# Patient Record
Sex: Female | Born: 1960 | Race: Black or African American | Hispanic: No | Marital: Single | Smoking: Current every day smoker
Health system: Southern US, Community
[De-identification: ages and names within clinical notes are randomized; demographics above are authoritative.]

## PROBLEM LIST (undated history)

## (undated) DIAGNOSIS — F4310 Post-traumatic stress disorder, unspecified: Secondary | ICD-10-CM

## (undated) DIAGNOSIS — E785 Hyperlipidemia, unspecified: Secondary | ICD-10-CM

## (undated) DIAGNOSIS — K219 Gastro-esophageal reflux disease without esophagitis: Secondary | ICD-10-CM

## (undated) DIAGNOSIS — E119 Type 2 diabetes mellitus without complications: Secondary | ICD-10-CM

## (undated) DIAGNOSIS — J45909 Unspecified asthma, uncomplicated: Secondary | ICD-10-CM

## (undated) DIAGNOSIS — M719 Bursopathy, unspecified: Secondary | ICD-10-CM

## (undated) DIAGNOSIS — F32A Depression, unspecified: Secondary | ICD-10-CM

## (undated) DIAGNOSIS — F419 Anxiety disorder, unspecified: Secondary | ICD-10-CM

## (undated) DIAGNOSIS — F329 Major depressive disorder, single episode, unspecified: Secondary | ICD-10-CM

## (undated) DIAGNOSIS — G8929 Other chronic pain: Secondary | ICD-10-CM

## (undated) DIAGNOSIS — M549 Dorsalgia, unspecified: Secondary | ICD-10-CM

## (undated) DIAGNOSIS — M1990 Unspecified osteoarthritis, unspecified site: Secondary | ICD-10-CM

## (undated) HISTORY — PX: BILATERAL CARPAL TUNNEL RELEASE: SHX6508

## (undated) HISTORY — PX: SHOULDER ARTHROSCOPY: SHX128

## (undated) HISTORY — PX: FOOT SURGERY: SHX648

---

## 1998-09-12 ENCOUNTER — Ambulatory Visit (HOSPITAL_COMMUNITY): Admission: RE | Admit: 1998-09-12 | Discharge: 1998-09-12 | Payer: Self-pay | Admitting: *Deleted

## 1999-01-12 ENCOUNTER — Other Ambulatory Visit: Admission: RE | Admit: 1999-01-12 | Discharge: 1999-01-12 | Payer: Self-pay | Admitting: Gynecology

## 1999-05-21 ENCOUNTER — Encounter: Payer: Self-pay | Admitting: Emergency Medicine

## 1999-05-21 ENCOUNTER — Emergency Department (HOSPITAL_COMMUNITY): Admission: EM | Admit: 1999-05-21 | Discharge: 1999-05-21 | Payer: Self-pay | Admitting: Emergency Medicine

## 1999-06-21 ENCOUNTER — Other Ambulatory Visit: Admission: RE | Admit: 1999-06-21 | Discharge: 1999-06-21 | Payer: Self-pay | Admitting: Gynecology

## 1999-06-21 ENCOUNTER — Encounter (INDEPENDENT_AMBULATORY_CARE_PROVIDER_SITE_OTHER): Payer: Self-pay

## 2002-05-01 ENCOUNTER — Ambulatory Visit (HOSPITAL_COMMUNITY): Admission: RE | Admit: 2002-05-01 | Discharge: 2002-05-01 | Payer: Self-pay | Admitting: Family Medicine

## 2002-08-12 ENCOUNTER — Encounter: Payer: Self-pay | Admitting: *Deleted

## 2002-08-12 ENCOUNTER — Emergency Department (HOSPITAL_COMMUNITY): Admission: EM | Admit: 2002-08-12 | Discharge: 2002-08-12 | Payer: Self-pay | Admitting: Emergency Medicine

## 2002-08-26 ENCOUNTER — Encounter: Payer: Self-pay | Admitting: Family Medicine

## 2002-08-26 ENCOUNTER — Ambulatory Visit (HOSPITAL_COMMUNITY): Admission: RE | Admit: 2002-08-26 | Discharge: 2002-08-26 | Payer: Self-pay | Admitting: Family Medicine

## 2002-12-23 ENCOUNTER — Emergency Department (HOSPITAL_COMMUNITY): Admission: EM | Admit: 2002-12-23 | Discharge: 2002-12-23 | Payer: Self-pay | Admitting: Emergency Medicine

## 2003-05-12 ENCOUNTER — Ambulatory Visit (HOSPITAL_COMMUNITY): Admission: RE | Admit: 2003-05-12 | Discharge: 2003-05-12 | Payer: Self-pay | Admitting: Family Medicine

## 2003-08-26 ENCOUNTER — Ambulatory Visit (HOSPITAL_COMMUNITY): Admission: RE | Admit: 2003-08-26 | Discharge: 2003-08-26 | Payer: Self-pay | Admitting: Family Medicine

## 2003-10-01 ENCOUNTER — Encounter: Admission: RE | Admit: 2003-10-01 | Discharge: 2003-10-01 | Payer: Self-pay | Admitting: Orthopedic Surgery

## 2003-10-22 ENCOUNTER — Encounter: Admission: RE | Admit: 2003-10-22 | Discharge: 2003-10-22 | Payer: Self-pay | Admitting: Orthopedic Surgery

## 2005-06-15 ENCOUNTER — Emergency Department (HOSPITAL_COMMUNITY): Admission: EM | Admit: 2005-06-15 | Discharge: 2005-06-15 | Payer: Self-pay | Admitting: Emergency Medicine

## 2005-08-01 ENCOUNTER — Ambulatory Visit: Payer: Self-pay | Admitting: Internal Medicine

## 2005-08-10 ENCOUNTER — Ambulatory Visit (HOSPITAL_COMMUNITY): Admission: RE | Admit: 2005-08-10 | Discharge: 2005-08-10 | Payer: Self-pay | Admitting: Internal Medicine

## 2005-08-10 ENCOUNTER — Encounter (INDEPENDENT_AMBULATORY_CARE_PROVIDER_SITE_OTHER): Payer: Self-pay | Admitting: Internal Medicine

## 2005-08-13 ENCOUNTER — Encounter: Admission: RE | Admit: 2005-08-13 | Discharge: 2005-10-04 | Payer: Self-pay | Admitting: *Deleted

## 2005-08-20 ENCOUNTER — Ambulatory Visit: Payer: Self-pay | Admitting: Internal Medicine

## 2005-09-18 ENCOUNTER — Ambulatory Visit (HOSPITAL_COMMUNITY): Admission: RE | Admit: 2005-09-18 | Discharge: 2005-09-18 | Payer: Self-pay | Admitting: Hospitalist

## 2005-09-18 ENCOUNTER — Ambulatory Visit: Payer: Self-pay | Admitting: Hospitalist

## 2005-10-18 ENCOUNTER — Ambulatory Visit: Payer: Self-pay | Admitting: Internal Medicine

## 2005-11-23 ENCOUNTER — Ambulatory Visit: Payer: Self-pay | Admitting: Internal Medicine

## 2005-12-11 ENCOUNTER — Ambulatory Visit: Payer: Self-pay | Admitting: Internal Medicine

## 2005-12-13 ENCOUNTER — Ambulatory Visit (HOSPITAL_COMMUNITY): Admission: RE | Admit: 2005-12-13 | Discharge: 2005-12-13 | Payer: Self-pay | Admitting: *Deleted

## 2005-12-27 ENCOUNTER — Ambulatory Visit (HOSPITAL_COMMUNITY): Admission: RE | Admit: 2005-12-27 | Discharge: 2005-12-27 | Payer: Self-pay | Admitting: *Deleted

## 2006-01-21 ENCOUNTER — Ambulatory Visit: Payer: Self-pay | Admitting: Hospitalist

## 2006-02-04 ENCOUNTER — Ambulatory Visit: Payer: Self-pay | Admitting: Internal Medicine

## 2006-03-05 ENCOUNTER — Encounter: Admission: RE | Admit: 2006-03-05 | Discharge: 2006-03-05 | Payer: Self-pay | Admitting: Orthopaedic Surgery

## 2006-03-28 ENCOUNTER — Encounter: Admission: RE | Admit: 2006-03-28 | Discharge: 2006-03-28 | Payer: Self-pay | Admitting: Orthopaedic Surgery

## 2006-04-19 DIAGNOSIS — R030 Elevated blood-pressure reading, without diagnosis of hypertension: Secondary | ICD-10-CM | POA: Insufficient documentation

## 2006-04-19 DIAGNOSIS — N920 Excessive and frequent menstruation with regular cycle: Secondary | ICD-10-CM

## 2006-04-19 DIAGNOSIS — F17200 Nicotine dependence, unspecified, uncomplicated: Secondary | ICD-10-CM | POA: Insufficient documentation

## 2006-04-19 DIAGNOSIS — R945 Abnormal results of liver function studies: Secondary | ICD-10-CM

## 2006-04-19 DIAGNOSIS — IMO0002 Reserved for concepts with insufficient information to code with codable children: Secondary | ICD-10-CM

## 2006-04-19 DIAGNOSIS — R05 Cough: Secondary | ICD-10-CM | POA: Insufficient documentation

## 2006-04-19 DIAGNOSIS — G2581 Restless legs syndrome: Secondary | ICD-10-CM | POA: Insufficient documentation

## 2006-04-19 DIAGNOSIS — G5600 Carpal tunnel syndrome, unspecified upper limb: Secondary | ICD-10-CM

## 2006-04-19 DIAGNOSIS — E785 Hyperlipidemia, unspecified: Secondary | ICD-10-CM

## 2006-05-20 ENCOUNTER — Ambulatory Visit: Payer: Self-pay | Admitting: Internal Medicine

## 2006-05-20 ENCOUNTER — Encounter (INDEPENDENT_AMBULATORY_CARE_PROVIDER_SITE_OTHER): Payer: Self-pay | Admitting: Internal Medicine

## 2006-05-20 LAB — CONVERTED CEMR LAB: Vitamin B-12: 348 pg/mL (ref 211–911)

## 2006-06-17 DIAGNOSIS — F1010 Alcohol abuse, uncomplicated: Secondary | ICD-10-CM | POA: Insufficient documentation

## 2006-07-06 ENCOUNTER — Emergency Department (HOSPITAL_COMMUNITY): Admission: EM | Admit: 2006-07-06 | Discharge: 2006-07-06 | Payer: Self-pay | Admitting: Emergency Medicine

## 2006-07-18 ENCOUNTER — Encounter (INDEPENDENT_AMBULATORY_CARE_PROVIDER_SITE_OTHER): Payer: Self-pay | Admitting: *Deleted

## 2006-07-18 ENCOUNTER — Ambulatory Visit: Payer: Self-pay | Admitting: Internal Medicine

## 2006-07-18 LAB — CONVERTED CEMR LAB
ALT: 22 units/L (ref 0–35)
AST: 17 units/L (ref 0–37)
Albumin: 3.8 g/dL (ref 3.5–5.2)
BUN: 8 mg/dL (ref 6–23)
CO2: 26 meq/L (ref 19–32)
Calcium: 9.2 mg/dL (ref 8.4–10.5)
Chloride: 102 meq/L (ref 96–112)
Potassium: 4.5 meq/L (ref 3.5–5.3)

## 2006-08-15 ENCOUNTER — Ambulatory Visit: Payer: Self-pay | Admitting: Internal Medicine

## 2006-08-15 ENCOUNTER — Encounter (INDEPENDENT_AMBULATORY_CARE_PROVIDER_SITE_OTHER): Payer: Self-pay | Admitting: *Deleted

## 2006-08-15 DIAGNOSIS — K219 Gastro-esophageal reflux disease without esophagitis: Secondary | ICD-10-CM | POA: Insufficient documentation

## 2006-08-15 DIAGNOSIS — B351 Tinea unguium: Secondary | ICD-10-CM

## 2006-10-09 ENCOUNTER — Ambulatory Visit: Payer: Self-pay | Admitting: *Deleted

## 2006-10-09 ENCOUNTER — Encounter (INDEPENDENT_AMBULATORY_CARE_PROVIDER_SITE_OTHER): Payer: Self-pay | Admitting: *Deleted

## 2006-12-19 ENCOUNTER — Telehealth: Payer: Self-pay | Admitting: *Deleted

## 2007-10-10 ENCOUNTER — Emergency Department (HOSPITAL_COMMUNITY): Admission: EM | Admit: 2007-10-10 | Discharge: 2007-10-10 | Payer: Self-pay | Admitting: Emergency Medicine

## 2007-10-11 ENCOUNTER — Emergency Department (HOSPITAL_COMMUNITY): Admission: EM | Admit: 2007-10-11 | Discharge: 2007-10-11 | Payer: Self-pay | Admitting: Emergency Medicine

## 2008-09-22 ENCOUNTER — Ambulatory Visit: Payer: Self-pay | Admitting: Internal Medicine

## 2008-09-22 ENCOUNTER — Encounter (INDEPENDENT_AMBULATORY_CARE_PROVIDER_SITE_OTHER): Payer: Self-pay | Admitting: Internal Medicine

## 2009-06-13 ENCOUNTER — Emergency Department (HOSPITAL_COMMUNITY): Admission: EM | Admit: 2009-06-13 | Discharge: 2009-06-13 | Payer: Self-pay | Admitting: Emergency Medicine

## 2009-08-30 ENCOUNTER — Emergency Department (HOSPITAL_COMMUNITY): Admission: EM | Admit: 2009-08-30 | Discharge: 2009-08-30 | Payer: Self-pay | Admitting: Emergency Medicine

## 2010-01-29 ENCOUNTER — Emergency Department (HOSPITAL_COMMUNITY): Admission: EM | Admit: 2010-01-29 | Discharge: 2010-01-29 | Payer: Self-pay | Admitting: Emergency Medicine

## 2010-04-29 ENCOUNTER — Emergency Department (HOSPITAL_COMMUNITY): Admission: EM | Admit: 2010-04-29 | Discharge: 2010-04-30 | Payer: Self-pay | Admitting: Emergency Medicine

## 2010-07-11 NOTE — Letter (Signed)
Summary: MAMMOGRAM  MAMMOGRAM   Imported By: Margie Billet 10/13/2009 11:37:46  _____________________________________________________________________  External Attachment:    Type:   Image     Comment:   External Document

## 2010-08-27 LAB — BASIC METABOLIC PANEL WITH GFR
BUN: 10 mg/dL (ref 6–23)
CO2: 26 meq/L (ref 19–32)
Calcium: 8.7 mg/dL (ref 8.4–10.5)
Chloride: 105 meq/L (ref 96–112)
Creatinine, Ser: 0.81 mg/dL (ref 0.4–1.2)
GFR calc non Af Amer: >60 mL/min
Glucose, Bld: 106 mg/dL — ABNORMAL HIGH (ref 70–99)
Potassium: 3.6 meq/L (ref 3.5–5.1)
Sodium: 139 meq/L (ref 135–145)

## 2010-08-27 LAB — GLUCOSE, CAPILLARY: Glucose-Capillary: 118 mg/dL — ABNORMAL HIGH (ref 70–99)

## 2013-01-15 ENCOUNTER — Encounter (HOSPITAL_COMMUNITY): Payer: Self-pay | Admitting: Cardiology

## 2013-01-15 ENCOUNTER — Emergency Department (HOSPITAL_COMMUNITY)
Admission: EM | Admit: 2013-01-15 | Discharge: 2013-01-15 | Disposition: A | Discharge status: Home / Self Care | Payer: Self-pay | Attending: Emergency Medicine | Admitting: Emergency Medicine

## 2013-01-15 DIAGNOSIS — Z79899 Other long term (current) drug therapy: Secondary | ICD-10-CM | POA: Insufficient documentation

## 2013-01-15 DIAGNOSIS — G8929 Other chronic pain: Secondary | ICD-10-CM | POA: Insufficient documentation

## 2013-01-15 DIAGNOSIS — M545 Low back pain, unspecified: Secondary | ICD-10-CM | POA: Insufficient documentation

## 2013-01-15 DIAGNOSIS — E785 Hyperlipidemia, unspecified: Secondary | ICD-10-CM | POA: Insufficient documentation

## 2013-01-15 DIAGNOSIS — Z7982 Long term (current) use of aspirin: Secondary | ICD-10-CM | POA: Insufficient documentation

## 2013-01-15 DIAGNOSIS — M62830 Muscle spasm of back: Secondary | ICD-10-CM

## 2013-01-15 DIAGNOSIS — M5380 Other specified dorsopathies, site unspecified: Secondary | ICD-10-CM | POA: Insufficient documentation

## 2013-01-15 DIAGNOSIS — E119 Type 2 diabetes mellitus without complications: Secondary | ICD-10-CM | POA: Insufficient documentation

## 2013-01-15 HISTORY — DX: Hyperlipidemia, unspecified: E78.5

## 2013-01-15 HISTORY — DX: Type 2 diabetes mellitus without complications: E11.9

## 2013-01-15 HISTORY — DX: Other chronic pain: G89.29

## 2013-01-15 HISTORY — DX: Dorsalgia, unspecified: M54.9

## 2013-01-15 MED ORDER — HYDROMORPHONE HCL PF 1 MG/ML IJ SOLN
1.0000 mg | Freq: Once | INTRAMUSCULAR | Status: AC
  Administered 2013-01-15: 1 mg via INTRAMUSCULAR
  Filled 2013-01-15: qty 1

## 2013-01-15 MED ORDER — OXYCODONE-ACETAMINOPHEN 5-325 MG PO TABS: 1.0000 | ORAL_TABLET | ORAL | Status: DC | PRN

## 2013-01-15 MED ORDER — METHOCARBAMOL 500 MG PO TABS
750.0000 mg | ORAL_TABLET | Freq: Once | ORAL | Status: AC
  Administered 2013-01-15: 750 mg via ORAL
  Filled 2013-01-15: qty 2

## 2013-01-15 MED ORDER — METHOCARBAMOL 750 MG PO TABS: 750.0000 mg | ORAL_TABLET | Freq: Four times a day (QID) | ORAL | Status: DC | PRN

## 2013-01-15 NOTE — ED Notes (Signed)
Pt reports lower back pain over the past couple of days. States that the pain is on-going, and that she normally takes medication but its not helping. Denies any recent injury.

## 2013-01-15 NOTE — ED Provider Notes (Signed)
Medical screening examination/treatment/procedure(s) were performed by non-physician practitioner and as supervising physician I was immediately available for consultation/collaboration.   Audree Camel, MD 01/15/13 [phone]

## 2013-01-15 NOTE — ED Notes (Signed)
Pt reports lower back pain that shoots down left leg since Thursday.  Pt has HX of degenerative disc disease and her current pain meds are not helping.  Pt denies injury. Pt alert oriented X4

## 2013-01-15 NOTE — ED Provider Notes (Signed)
CSN: [id number]     Arrival date & time 01/15/13  1417 History  This chart was scribed for non-physician practitioner Dierdre Forth, PA-C, working with Audree Camel, MD, by Yevette Edwards, ED Scribe. This patient was seen in room TR05C/TR05C and the patient's care was started at 3:47 PM.   First MD Initiated Contact with Patient 01/15/13 1510     Chief Complaint  Patient presents with  . Back Pain    Patient is a 52 y.o. female presenting with back pain. The history is provided by the patient. No language interpreter was used.  Back Pain Location:  Sacro-iliac joint Radiates to:  L posterior upper leg Pain severity:  Severe Progression:  Worsening Chronicity:  Chronic Context: not falling and not MVA   Relieved by:  Nothing Worsened by:  Ambulation and movement Associated symptoms: no bladder incontinence, no bowel incontinence, no dysuria, no numbness and no weakness   Risk factors: obesity    HPI Comments: Shawna Horton is a 52 y.o. female, with a history of chronic back pain, who presents to the Emergency Department complaining of worsening pain to her lower back. She has had the back pain for years, but the pain has worsened in the past month and has acutely worsened in the past week. The pt states that for approximately a week, the pain has been radiating down her the lateral aspect of her left leg, and she ranks the pain as a 10/10. She states that ambulation, standing for an extended period and movement increase the pain, and that nothing lessens the pain. The pt denies experiencing any recent injuries or trauma to her lower back. She also denies numbness, weakness, urinary incontinence, bowel incontinence, or dysuria.  The pt is currently taking tromadol, hydrocodone, and cyclobenzaprine daily, but she reports little resolution to the back pain with her current medication. She has received epidural injections to her back, and she reports improvement due to the injections.  She received an injection of pain medication a week ago, and she reports no improvement with it.  She denies attempting any back exercises to strengthen and stretch her back.  She is scheduled for an MRI at the [facility]. on February 03, 2013.     Past Medical History  Diagnosis Date  . Back pain, chronic   . Diabetes mellitus without complication   . Hyperlipemia    History reviewed. No pertinent past surgical history. History reviewed. No pertinent family history. History  Substance Use Topics  . Smoking status: Never Smoker   . Smokeless tobacco: Not on file  . Alcohol Use: Yes   No OB history provided.  Review of Systems  Gastrointestinal: Negative for bowel incontinence.  Genitourinary: Negative for bladder incontinence, dysuria and urgency.  Musculoskeletal: Positive for back pain.  Neurological: Negative for weakness and numbness.  All other systems reviewed and are negative.    Allergies  Review of patient's allergies indicates no known allergies.  Home Medications   Current Outpatient Rx  Name  Route  Sig  Dispense  Refill  . albuterol (PROVENTIL HFA;VENTOLIN HFA) 108 (90 BASE) MCG/ACT inhaler   Inhalation   Inhale 2 puffs into the lungs every 6 (six) hours as needed for wheezing or shortness of breath.         Marland Kitchen aspirin EC 81 MG tablet   Oral   Take 81 mg by mouth daily.         . Cholecalciferol (VITAMIN D PO)   Oral  Take 2 tablets by mouth daily.         . cyclobenzaprine (FLEXERIL) 10 MG tablet   Oral   Take 10 mg by mouth 3 (three) times daily as needed for muscle spasms.         Marland Kitchen ibuprofen (ADVIL,MOTRIN) 800 MG tablet   Oral   Take 800 mg by mouth every 8 (eight) hours as needed for pain.         . metFORMIN (GLUCOPHAGE) 500 MG tablet   Oral   Take 500 mg by mouth 2 (two) times daily with a meal.         . PRAVASTATIN SODIUM PO   Oral   Take 1 tablet by mouth daily.         . SERTRALINE HCL PO   Oral   Take 1 tablet by  mouth daily.         . traMADol (ULTRAM) 50 MG tablet   Oral   Take 100 mg by mouth every 6 (six) hours as needed for pain.         . VENLAFAXINE HCL PO   Oral   Take 1 each by mouth daily.         . methocarbamol (ROBAXIN) 750 MG tablet   Oral   Take 1 tablet (750 mg total) by mouth 4 (four) times daily as needed (Take 1 tablet every 6 hours as needed for muscle spasms.).   20 tablet   0   . oxyCODONE-acetaminophen (PERCOCET) 5-325 MG per tablet   Oral   Take 1 tablet by mouth every 4 (four) hours as needed for pain (Take 1- 2 tablets every 4 - 6 hours as needed for pain.).   20 tablet   0    Triage Vitals: BP 157/89  Pulse 79  Temp(Src) 98.2 F (36.8 C) (Oral)  Resp 16  SpO2 98%  Physical Exam  Nursing note and vitals reviewed. Constitutional: She appears well-developed and well-nourished. No distress.  Obese.  HENT:  Head: Normocephalic and atraumatic.  Mouth/Throat: Oropharynx is clear and moist. No oropharyngeal exudate.  Eyes: Conjunctivae are normal.  Neck: Normal range of motion. Neck supple.  Full ROM without pain  Cardiovascular: Normal rate, regular rhythm and intact distal pulses.   Pulmonary/Chest: Effort normal and breath sounds normal. No respiratory distress. She has no wheezes.  Abdominal: Soft. She exhibits no distension. There is no tenderness.  Musculoskeletal:  Full range of motion of the T-spine and L-spine No tenderness to palpation of the spinous processes of the T-spine or L-spine.  Pain to palpation of the L paraspinal muscles  Lymphadenopathy:    She has no cervical adenopathy.  Neurological: She is alert. She has normal reflexes. She exhibits normal muscle tone. Coordination normal. GCS eye subscore is 4. GCS verbal subscore is 5. GCS motor subscore is 6.  Reflex Scores:      Patellar reflexes are 2+ on the right side and 2+ on the left side.      Achilles reflexes are 2+ on the right side and 2+ on the left side. Speech is clear  and goal oriented, follows commands Normal strength in upper and lower extremities bilaterally including dorsiflexion and plantar flexion, strong and equal grip strength Sensation normal to light and sharp touch Moves extremities without ataxia, coordination intact Stiff gait.  Normal balance  Skin: Skin is warm and dry. No rash noted. She is not diaphoretic. No erythema.    ED Course  DIAGNOSTIC STUDIES: Oxygen Saturation is 98% on room air, normal by my interpretation.    COORDINATION OF CARE:  3:55 PM-Discussed treatment plan with patient which includes different pain medication and exercises for her back, and the patient agreed to the plan.   Procedures (including critical care time)  Labs Reviewed - No data to display No results found. 1. Back muscle spasm   2. Low back pain     MDM  Antoine C Lingenfelter presents for acute exacerbation of her chronic back pain.  Nontraumatic exacerbation.  Patient with back pain.  No neurological deficits and normal neuro exam.  Patient can walk but states is painful.  No loss of bowel or bladder control.  No concern for cauda equina.  No fever, night sweats, weight loss, h/o cancer, IVDU.  RICE protocol and pain medicine indicated and discussed with patient.  I have also discussed reasons to return immediately to the ER.  Patient expresses understanding and agrees with plan.  I personally performed the services described in this documentation, which was scribed in my presence. The recorded information has been reviewed and is accurate.      Dahlia Client Russie Gulledge, PA-C 01/15/13 [phone]

## 2014-03-01 ENCOUNTER — Emergency Department (HOSPITAL_COMMUNITY)
Admission: EM | Admit: 2014-03-01 | Discharge: 2014-03-01 | Disposition: A | Discharge status: Home / Self Care | Payer: Self-pay | Attending: Emergency Medicine | Admitting: Emergency Medicine

## 2014-03-01 ENCOUNTER — Encounter (HOSPITAL_COMMUNITY): Payer: Self-pay | Admitting: Emergency Medicine

## 2014-03-01 DIAGNOSIS — M545 Low back pain, unspecified: Secondary | ICD-10-CM | POA: Insufficient documentation

## 2014-03-01 DIAGNOSIS — G8929 Other chronic pain: Secondary | ICD-10-CM | POA: Insufficient documentation

## 2014-03-01 DIAGNOSIS — Z7982 Long term (current) use of aspirin: Secondary | ICD-10-CM | POA: Insufficient documentation

## 2014-03-01 DIAGNOSIS — E119 Type 2 diabetes mellitus without complications: Secondary | ICD-10-CM | POA: Insufficient documentation

## 2014-03-01 DIAGNOSIS — Z79899 Other long term (current) drug therapy: Secondary | ICD-10-CM | POA: Insufficient documentation

## 2014-03-01 LAB — URINALYSIS, ROUTINE W REFLEX MICROSCOPIC
Bilirubin Urine: NEGATIVE
Glucose, UA: NEGATIVE mg/dL
Hgb urine dipstick: NEGATIVE
KETONES UR: NEGATIVE mg/dL
LEUKOCYTES UA: NEGATIVE
NITRITE: NEGATIVE
PH: 6 (ref 5.0–8.0)
Protein, ur: NEGATIVE mg/dL
SPECIFIC GRAVITY, URINE: 1.016 (ref 1.005–1.030)
UROBILINOGEN UA: 0.2 mg/dL (ref 0.0–1.0)

## 2014-03-01 MED ORDER — OXYCODONE-ACETAMINOPHEN 5-325 MG PO TABS: 1.0000 | ORAL_TABLET | ORAL | Status: DC | PRN

## 2014-03-01 NOTE — ED Notes (Signed)
Left back pain for a couple of weeks was seen at [facility] in [place] last week and given 2 shots  Helped a little pain is back

## 2014-03-01 NOTE — ED Provider Notes (Signed)
CSN: [id number]     Arrival date & time 03/01/14  0809 History  This chart was scribed for non-physician practitioner, Jaynie Crumble, PA-C working with Audree Camel, MD by Greggory Stallion, ED scribe. This patient was seen in room TR10C/TR10C and the patient's care was started at 9:01 AM.   Chief Complaint  Patient presents with  . Back Pain   The history is provided by the patient. No language interpreter was used.   HPI Comments: Shawna Horton is a 53 y.o. female who presents to the Emergency Department complaining of sharp left mid to lower back pain that started 2 weeks ago. Pain radiates into her left leg but states that is chronic. Denies recent injury. States this is not similar to her chronic back pain. Pt was seen at the ED in [place] last week and given two shots with some relief. Bending and turning worsen the pain. She has taken tramadol, flexeril and 800 mg ibuprofen with little relief. Pt has also used a heating pad and used ice with no relief. Denies dysuria, urinary frequency, urgency.   Past Medical History  Diagnosis Date  . Back pain, chronic   . Diabetes mellitus without complication   . Hyperlipemia    History reviewed. No pertinent past surgical history. No family history on file. History  Substance Use Topics  . Smoking status: Never Smoker   . Smokeless tobacco: Not on file  . Alcohol Use: Yes   OB History   Grav Para Term Preterm Abortions TAB SAB Ect Mult Living                 Review of Systems  Genitourinary: Negative for dysuria, urgency and frequency.  Musculoskeletal: Positive for back pain and myalgias.  All other systems reviewed and are negative.  Allergies  Review of patient's allergies indicates no known allergies.  Home Medications   Prior to Admission medications   Medication Sig Start Date End Date Taking? Authorizing Provider  albuterol (PROVENTIL HFA;VENTOLIN HFA) 108 (90 BASE) MCG/ACT inhaler Inhale 2 puffs into the lungs  every 6 (six) hours as needed for wheezing or shortness of breath.    Historical Provider, MD  aspirin EC 81 MG tablet Take 81 mg by mouth daily.    Historical Provider, MD  Cholecalciferol (VITAMIN D PO) Take 2 tablets by mouth daily.    Historical Provider, MD  cyclobenzaprine (FLEXERIL) 10 MG tablet Take 10 mg by mouth 3 (three) times daily as needed for muscle spasms.    Historical Provider, MD  ibuprofen (ADVIL,MOTRIN) 800 MG tablet Take 800 mg by mouth every 8 (eight) hours as needed for pain.    Historical Provider, MD  metFORMIN (GLUCOPHAGE) 500 MG tablet Take 500 mg by mouth 2 (two) times daily with a meal.    Historical Provider, MD  methocarbamol (ROBAXIN) 750 MG tablet Take 1 tablet (750 mg total) by mouth 4 (four) times daily as needed (Take 1 tablet every 6 hours as needed for muscle spasms.). 01/15/13   Hannah Muthersbaugh, PA-C  oxyCODONE-acetaminophen (PERCOCET) 5-325 MG per tablet Take 1 tablet by mouth every 4 (four) hours as needed for pain (Take 1- 2 tablets every 4 - 6 hours as needed for pain.). 01/15/13   Hannah Muthersbaugh, PA-C  PRAVASTATIN SODIUM PO Take 1 tablet by mouth daily.    Historical Provider, MD  SERTRALINE HCL PO Take 1 tablet by mouth daily.    Historical Provider, MD  traMADol (ULTRAM) 50 MG tablet Take  100 mg by mouth every 6 (six) hours as needed for pain.    Historical Provider, MD  VENLAFAXINE HCL PO Take 1 each by mouth daily.    Historical Provider, MD   BP 139/99  Pulse 96  Temp(Src) 98.1 F (36.7 C) (Oral)  Resp 18  Ht 5' 8.5" (1.74 m)  Wt 250 lb (113.399 kg)  BMI 37.46 kg/m2  SpO2 97%  Physical Exam  Nursing note and vitals reviewed. Constitutional: She is oriented to person, place, and time. She appears well-developed and well-nourished. No distress.  HENT:  Head: Normocephalic and atraumatic.  Eyes: Conjunctivae and EOM are normal.  Neck: Neck supple. No tracheal deviation present.  Cardiovascular: Normal rate.   Pulmonary/Chest: Effort  normal. No respiratory distress.  Abdominal: There is no tenderness.  Left CVA tenderness  Musculoskeletal: Normal range of motion.  No midline lumbar spine tenderness, tenderness over left paravertebral spinous muscles, left flank. No pain with straight leg raise bilaterally.  Neurological: She is alert and oriented to person, place, and time.  5/5 and equal lower extremity strength. 2+ and equal patellar reflexes bilaterally. Pt able to dorsiflex bilateral toes and feet with good strength against resistance. Equal sensation bilaterally over thighs and lower legs.    Skin: Skin is warm and dry.  Psychiatric: She has a normal mood and affect. Her behavior is normal.    ED Course  Procedures (including critical care time)  DIAGNOSTIC STUDIES: Oxygen Saturation is 97% on RA, normal by my interpretation.    COORDINATION OF CARE: 9:04 AM-Discussed treatment plan which includes UA with pt at bedside and pt agreed to plan.   Labs Review Labs Reviewed - No data to display  Imaging Review No results found.   EKG Interpretation None      MDM   Final diagnoses:  Left-sided low back pain without sciatica    Patient with left lower back pain, neurovascularly intact, left CVA tenderness present as well. Will get urinalysis.   Urinalysis is negative. I suspect pain is mainly muscular, doubt radiculopathy. Will treat with muscle relaxants, pain medication. Patient is already on Flexeril. Will add Percocet for severe pain. Instructed to followup with her primary care Dr. Patient states she's waiting to get in to pain management for chronic back pain.  Filed Vitals:   03/01/14 0814 03/01/14 1005  BP: 139/99 150/93  Pulse: 96 83  Temp: 98.1 F (36.7 C) 98.1 F (36.7 C)  TempSrc: Oral Oral  Resp: 18 17  Height: 5' 8.5" (1.74 m)   Weight: 250 lb (113.399 kg)   SpO2: 97% 99%     I personally performed the services described in this documentation, which was scribed in my  presence. The recorded information has been reviewed and is accurate.  Lottie Mussel, PA-C 03/01/14 1625

## 2014-03-01 NOTE — Discharge Instructions (Signed)
Continue your current medication. Take Percocet for severe pain. Try rolling out your back with a lacrosse ball. Try stretches and exercises listed below. Followup with your doctor   Back Exercises Back exercises help treat and prevent back injuries. The goal of back exercises is to increase the strength of your abdominal and back muscles and the flexibility of your back. These exercises should be started when you no longer have back pain. Back exercises include:  Pelvic Tilt. Lie on your back with your knees bent. Tilt your pelvis until the lower part of your back is against the floor. Hold this position 5 to 10 sec and repeat 5 to 10 times.  Knee to Chest. Pull first 1 knee up against your chest and hold for 20 to 30 seconds, repeat this with the other knee, and then both knees. This may be done with the other leg straight or bent, whichever feels better.  Sit-Ups or Curl-Ups. Bend your knees 90 degrees. Start with tilting your pelvis, and do a partial, slow sit-up, lifting your trunk only 30 to 45 degrees off the floor. Take at least 2 to 3 seconds for each sit-up. Do not do sit-ups with your knees out straight. If partial sit-ups are difficult, simply do the above but with only tightening your abdominal muscles and holding it as directed.  Hip-Lift. Lie on your back with your knees flexed 90 degrees. Push down with your feet and shoulders as you raise your hips a couple inches off the floor; hold for 10 seconds, repeat 5 to 10 times.  Back arches. Lie on your stomach, propping yourself up on bent elbows. Slowly press on your hands, causing an arch in your low back. Repeat 3 to 5 times. Any initial stiffness and discomfort should lessen with repetition over time.  Shoulder-Lifts. Lie face down with arms beside your body. Keep hips and torso pressed to floor as you slowly lift your head and shoulders off the floor. Do not overdo your exercises, especially in the beginning. Exercises may cause you  some mild back discomfort which lasts for a few minutes; however, if the pain is more severe, or lasts for more than 15 minutes, do not continue exercises until you see your caregiver. Improvement with exercise therapy for back problems is slow.  See your caregivers for assistance with developing a proper back exercise program. Document Released: 07/05/2004 Document Revised: 08/20/2011 Document Reviewed: 03/29/2011 [facility] Patient Information 2015 [place], [place]. This information is not intended to replace advice given to you by your health care provider. Make sure you discuss any questions you have with your health care provider.   Back Pain, Adult Low back pain is very common. About 1 in 5 people have back pain.The cause of low back pain is rarely dangerous. The pain often gets better over time.About half of people with a sudden onset of back pain feel better in just 2 weeks. About 8 in 10 people feel better by 6 weeks.  CAUSES Some common causes of back pain include:  Strain of the muscles or ligaments supporting the spine.  Wear and tear (degeneration) of the spinal discs.  Arthritis.  Direct injury to the back. DIAGNOSIS Most of the time, the direct cause of low back pain is not known.However, back pain can be treated effectively even when the exact cause of the pain is unknown.Answering your caregiver's questions about your overall health and symptoms is one of the most accurate ways to make sure the cause of your  pain is not dangerous. If your caregiver needs more information, he or she may order lab work or imaging tests (X-rays or MRIs).However, even if imaging tests show changes in your back, this usually does not require surgery. HOME CARE INSTRUCTIONS For many people, back pain returns.Since low back pain is rarely dangerous, it is often a condition that people can learn to [facility] their own.   Remain active. It is stressful on the back to sit or stand in one place. Do  not sit, drive, or stand in one place for more than 30 minutes at a time. Take short walks on level surfaces as soon as pain allows.Try to increase the length of time you walk each day.  Do not stay in bed.Resting more than 1 or 2 days can delay your recovery.  Do not avoid exercise or work.Your body is made to move.It is not dangerous to be active, even though your back may hurt.Your back will likely heal faster if you return to being active before your pain is gone.  Pay attention to your body when you bend and lift. Many people have less discomfortwhen lifting if they bend their knees, keep the load close to their bodies,and avoid twisting. Often, the most comfortable positions are those that put less stress on your recovering back.  Find a comfortable position to sleep. Use a firm mattress and lie on your side with your knees slightly bent. If you lie on your back, put a pillow under your knees.  Only take over-the-counter or prescription medicines as directed by your caregiver. Over-the-counter medicines to reduce pain and inflammation are often the most helpful.Your caregiver may prescribe muscle relaxant drugs.These medicines help dull your pain so you can more quickly return to your normal activities and healthy exercise.  Put ice on the injured area.  Put ice in a plastic bag.  Place a towel between your skin and the bag.  Leave the ice on for 15-20 minutes, 03-04 times a day for the first 2 to 3 days. After that, ice and heat may be alternated to reduce pain and spasms.  Ask your caregiver about trying back exercises and gentle massage. This may be of some benefit.  Avoid feeling anxious or stressed.Stress increases muscle tension and can worsen back pain.It is important to recognize when you are anxious or stressed and learn ways to manage it.Exercise is a great option. SEEK MEDICAL CARE IF:  You have pain that is not relieved with rest or medicine.  You have pain  that does not improve in 1 week.  You have new symptoms.  You are generally not feeling well. SEEK IMMEDIATE MEDICAL CARE IF:   You have pain that radiates from your back into your legs.  You develop new bowel or bladder control problems.  You have unusual weakness or numbness in your arms or legs.  You develop nausea or vomiting.  You develop abdominal pain.  You feel faint. Document Released: 05/28/2005 Document Revised: 11/27/2011 Document Reviewed: 09/29/2013 [facility] Patient Information 2015 [place], [place]. This information is not intended to replace advice given to you by your health care provider. Make sure you discuss any questions you have with your health care provider.

## 2014-03-01 NOTE — ED Provider Notes (Signed)
Medical screening examination/treatment/procedure(s) were performed by non-physician practitioner and as supervising physician I was immediately available for consultation/collaboration.  Audree Camel, MD 03/01/14 [phone]

## 2014-03-02 NOTE — Discharge Planning (Signed)
[facility] Community Liaison  Spoke to patient regarding primary care resources and establishing care with a provider. Patient states she currently is followed with care by the [facility] hospital. Resource guide and my contact information provided for any future questions or concerns. No other Community Liaison needs identified at this time.

## 2014-03-15 ENCOUNTER — Encounter (HOSPITAL_COMMUNITY): Payer: Self-pay | Admitting: Emergency Medicine

## 2014-03-15 ENCOUNTER — Emergency Department (HOSPITAL_COMMUNITY)
Admission: EM | Admit: 2014-03-15 | Discharge: 2014-03-15 | Disposition: A | Discharge status: Home / Self Care | Payer: Self-pay | Attending: Emergency Medicine | Admitting: Emergency Medicine

## 2014-03-15 DIAGNOSIS — Z7982 Long term (current) use of aspirin: Secondary | ICD-10-CM | POA: Insufficient documentation

## 2014-03-15 DIAGNOSIS — E119 Type 2 diabetes mellitus without complications: Secondary | ICD-10-CM | POA: Insufficient documentation

## 2014-03-15 DIAGNOSIS — S29012D Strain of muscle and tendon of back wall of thorax, subsequent encounter: Secondary | ICD-10-CM

## 2014-03-15 DIAGNOSIS — E785 Hyperlipidemia, unspecified: Secondary | ICD-10-CM | POA: Insufficient documentation

## 2014-03-15 DIAGNOSIS — S233XXD Sprain of ligaments of thoracic spine, subsequent encounter: Secondary | ICD-10-CM | POA: Insufficient documentation

## 2014-03-15 DIAGNOSIS — G8929 Other chronic pain: Secondary | ICD-10-CM | POA: Insufficient documentation

## 2014-03-15 DIAGNOSIS — Z79899 Other long term (current) drug therapy: Secondary | ICD-10-CM | POA: Insufficient documentation

## 2014-03-15 MED ORDER — KETOROLAC TROMETHAMINE 30 MG/ML IJ SOLN
30.0000 mg | Freq: Once | INTRAMUSCULAR | Status: AC
  Administered 2014-03-15: 30 mg via INTRAVENOUS
  Filled 2014-03-15: qty 1

## 2014-03-15 MED ORDER — DIAZEPAM 5 MG PO TABS: 5.0000 mg | ORAL_TABLET | Freq: Three times a day (TID) | ORAL | Status: DC | PRN

## 2014-03-15 MED ORDER — IBUPROFEN 800 MG PO TABS: 800.0000 mg | ORAL_TABLET | Freq: Three times a day (TID) | ORAL | Status: DC | PRN

## 2014-03-15 MED ORDER — OXYCODONE-ACETAMINOPHEN 5-325 MG PO TABS: 1.0000 | ORAL_TABLET | ORAL | Status: DC | PRN

## 2014-03-15 MED ORDER — MORPHINE SULFATE 4 MG/ML IJ SOLN
4.0000 mg | Freq: Once | INTRAMUSCULAR | Status: AC
  Administered 2014-03-15: 4 mg via INTRAVENOUS
  Filled 2014-03-15: qty 1

## 2014-03-15 NOTE — ED Provider Notes (Signed)
TIME SEEN: 10:32 PM  CHIEF COMPLAINT: Left thoracic back pain  HPI: Patient is a [age] y.o. F with history of chronic back pain, diabetes, hyperlipidemia who presents emergency department with exacerbation of her chronic back pain for the past 2 weeks. She has been seen in the emergency department in August as well September for the same pain. She denies any new injury or increased exertion. No numbness, tingling or focal weakness. No bowel or bladder incontinence. No urinary retention. No fever. No shortness of breath or chest pain. No dysuria or hematuria. Pain is worse with movement and palpation. Better with rest. No radiation of pain.. She is on tramadol and ibuprofen chronically for her chronic pain. She reports she has not followed up [facility].  ROS: See HPI Constitutional: no fever  Eyes: no drainage  ENT: no runny nose   Cardiovascular:  no chest pain  Resp: no SOB  GI: no vomiting GU: no dysuria Integumentary: no rash  Allergy: no hives  Musculoskeletal: no leg swelling  Neurological: no slurred speech ROS otherwise negative  PAST MEDICAL HISTORY/PAST SURGICAL HISTORY:  Past Medical History  Diagnosis Date  . Back pain, chronic   . Diabetes mellitus without complication   . Hyperlipemia     MEDICATIONS:  Prior to Admission medications   Medication Sig Start Date End Date Taking? Authorizing Provider  albuterol (PROVENTIL HFA;VENTOLIN HFA) 108 (90 BASE) MCG/ACT inhaler Inhale 2 puffs into the lungs every 6 (six) hours as needed for wheezing or shortness of breath.   Yes Historical Provider, MD  aspirin EC 81 MG tablet Take 81 mg by mouth daily.   Yes Historical Provider, MD  ATORVASTATIN CALCIUM PO Take 1.5 tablets by mouth daily.   Yes Historical Provider, MD  cyclobenzaprine (FLEXERIL) 10 MG tablet Take 10 mg by mouth 3 (three) times daily as needed for muscle spasms.   Yes Historical Provider, MD  ibuprofen (ADVIL,MOTRIN) 800 MG tablet  Take 800 mg by mouth every 6 (six) hours as needed.    Yes Historical Provider, MD  metFORMIN (GLUCOPHAGE) 500 MG tablet Take 500 mg by mouth 2 (two) times daily with a meal.   Yes Historical Provider, MD  SERTRALINE HCL PO Take 1 tablet by mouth daily.   Yes Historical Provider, MD  traMADol (ULTRAM) 50 MG tablet Take 100 mg by mouth every 6 (six) hours as needed for pain.   Yes Historical Provider, MD  Vitamin D, Ergocalciferol, (DRISDOL) 50000 UNITS CAPS capsule Take 50,000 Units by mouth every 7 (seven) days.   Yes Historical Provider, MD    ALLERGIES:  No Known Allergies  SOCIAL HISTORY:  History  Substance Use Topics  . Smoking status: Never Smoker   . Smokeless tobacco: Not on file  . Alcohol Use: Yes    FAMILY HISTORY: History reviewed. No pertinent family history.  EXAM: BP 129/84  Pulse 78  Temp(Src) 98.2 F (36.8 C) (Oral)  Resp 20  SpO2 97% CONSTITUTIONAL: Alert and oriented and responds appropriately to questions. Well-appearing; well-nourished HEAD: Normocephalic EYES: Conjunctivae clear, PERRL ENT: normal nose; no rhinorrhea; moist mucous membranes; pharynx without lesions noted NECK: Supple, no meningismus, no LAD  CARD: RRR; S1 and S2 appreciated; no murmurs, no clicks, no rubs, no gallops RESP: Normal chest excursion without splinting or tachypnea; breath sounds clear and equal bilaterally; no wheezes, no rhonchi, no rales,  ABD/GI: Normal bowel sounds; non-distended; soft, non-tender, no rebound, no guarding BACK:  The  back appears normal and is tender to palpation over the left flank without crepitus or ecchymosis or deformity, no midline spinal tenderness or step-off or deformity, there is no CVA tenderness EXT: Normal ROM in all joints; non-tender to palpation; no edema; normal capillary refill; no cyanosis    SKIN: Normal color for age and race; warm NEURO: Moves all extremities equally; sensation to light touch intact diffusely, cranial nerves II through  XII intact, 2+ deep tendon reflexes in bilateral lower extremities, strength 5/5 in all 4 extremities, negative straight leg raise PSYCH: The patient's mood and manner are appropriate. Grooming and personal hygiene are appropriate.  MEDICAL DECISION MAKING: Patient here with an exacerbation of her chronic back pain. Suspect muscle strain, spasm. We'll discharge with small prescription for ibuprofen, Percocet and Valium. Have strongly advised her to followup with her primary care physician as this is her third visit to the emergency department in 3 months for similar symptoms. Given a dose of IV medications prior to discharge. Discussed return precautions. Patient verbalizes understanding is comfortable plan.       Layla Maw Taneal Sonntag, DO 03/16/14 0001

## 2014-03-15 NOTE — Discharge Instructions (Signed)
Back Exercises °Back exercises help treat and prevent back injuries. The goal of back exercises is to increase the strength of your abdominal and back muscles and the flexibility of your back. These exercises should be started when you no longer have back pain. Back exercises include: °· Pelvic Tilt. Lie on your back with your knees bent. Tilt your pelvis until the lower part of your back is against the floor. Hold this position 5 to 10 sec and repeat 5 to 10 times. °· Knee to Chest. Pull first 1 knee up against your chest and hold for 20 to 30 seconds, repeat this with the other knee, and then both knees. This may be done with the other leg straight or bent, whichever feels better. °· Sit-Ups or Curl-Ups. Bend your knees 90 degrees. Start with tilting your pelvis, and do a partial, slow sit-up, lifting your trunk only 30 to 45 degrees off the floor. Take at least 2 to 3 seconds for each sit-up. Do not do sit-ups with your knees out straight. If partial sit-ups are difficult, simply do the above but with only tightening your abdominal muscles and holding it as directed. °· Hip-Lift. Lie on your back with your knees flexed 90 degrees. Push down with your feet and shoulders as you raise your hips a couple inches off the floor; hold for 10 seconds, repeat 5 to 10 times. °· Back arches. Lie on your stomach, propping yourself up on bent elbows. Slowly press on your hands, causing an arch in your low back. Repeat 3 to 5 times. Any initial stiffness and discomfort should lessen with repetition over time. °· Shoulder-Lifts. Lie face down with arms beside your body. Keep hips and torso pressed to floor as you slowly lift your head and shoulders off the floor. °Do not overdo your exercises, especially in the beginning. Exercises may cause you some mild back discomfort which lasts for a few minutes; however, if the pain is more severe, or lasts for more than 15 minutes, do not continue exercises until you see your caregiver.  Improvement with exercise therapy for back problems is slow.  °See your caregivers for assistance with developing a proper back exercise program. °Document Released: 07/05/2004 Document Revised: 08/20/2011 Document Reviewed: 03/29/2011 °ExitCare® Patient Information ©2015 [employer]. This information is not intended to replace advice given to you by your health care provider. Make sure you discuss any questions you have with your health care provider. ° °Thoracic Strain °You have injured the muscles or tendons that attach to the upper part of your back behind your chest. This injury is called a thoracic strain, thoracic sprain, or mid-back strain.  °CAUSES  °The cause of thoracic strain varies. A less severe injury involves pulling a muscle or tendon without tearing it. A more severe injury involves tearing (rupturing) a muscle or tendon. With less severe injuries, there may be little loss of strength. Sometimes, there are breaks (fractures) in the bones to which the muscles are attached. These fractures are rare, unless there was a direct hit (trauma) or you have weak bones due to osteoporosis or age. Longstanding strains may be caused by overuse or improper form during certain movements. Obesity can also increase your risk for back injuries. Sudden strains may occur due to injury or not warming up properly before exercise. Often, there is no obvious cause for a thoracic strain. °SYMPTOMS  °The main symptom is pain, especially with movement, such as during exercise. °DIAGNOSIS  °Your caregiver can usually tell   what is wrong by taking an X-ray and doing a physical exam. °TREATMENT  °· Physical therapy may be helpful for recovery. Your caregiver can give you exercises to do or refer you to a physical therapist after your pain improves. °· After your pain improves, strengthening and conditioning programs appropriate for your sport or occupation may be helpful. °· Always warm up before physical activities or  athletics. Stretching after physical activity may also help. °· Certain over-the-counter medicines may also help. Ask your caregiver if there are medicines that would help you. °If this is your first thoracic strain injury, proper care and proper healing time before starting activities should prevent long-term problems. Torn ligaments and tendons require as long to heal as broken bones. Average healing times may be only 1 week for a mild strain. For torn muscles and tendons, healing time may be up to 6 weeks to 2 months. °HOME CARE INSTRUCTIONS  °· Apply ice to the injured area. Ice massages may also be used as directed. °¨ Put ice in a plastic bag. °¨ Place a towel between your skin and the bag. °¨ Leave the ice on for 15-20 minutes, 03-04 times a day, for the first 2 days. °· Only take over-the-counter or prescription medicines for pain, discomfort, or fever as directed by your caregiver. °· Keep your appointments for physical therapy if this was prescribed. °· Use wraps and back braces as instructed. °SEEK IMMEDIATE MEDICAL CARE IF:  °· You have an increase in bruising, swelling, or pain. °· Your pain has not improved with medicines. °· You develop new shortness of breath, chest pain, or fever. °· Problems seem to be getting worse rather than better. °MAKE SURE YOU:  °· Understand these instructions. °· Will watch your condition. °· Will get help right away if you are not doing well or get worse. °Document Released: 08/18/2003 Document Revised: 08/20/2011 Document Reviewed: 07/14/2010 °ExitCare® Patient Information ©2015 [employer] LLC. This information is not intended to replace advice given to you by your health care provider. Make sure you discuss any questions you have with your health care provider. ° °

## 2014-03-15 NOTE — ED Notes (Signed)
Bed: WA01 Expected date:  Expected time:  Means of arrival:  Comments: EMS/back pain 

## 2014-03-15 NOTE — ED Notes (Signed)
Patient states that she began to have this particular pain about 2 weeks ago. She states that this pain is sharp with pain no matter what position she is in and this is different. She took motrin, tramadol, and flexeril for the pain and they did not help. These are medicines she has prescribed for her degenerative disc disease. Nothing makes the pain better or worse. [facility] and has a scheduled appointment this week, but could not wait for that appointment due to pain.

## 2014-06-18 ENCOUNTER — Other Ambulatory Visit: Payer: Self-pay | Admitting: Neurosurgery

## 2014-06-18 DIAGNOSIS — M4726 Other spondylosis with radiculopathy, lumbar region: Secondary | ICD-10-CM

## 2014-06-28 ENCOUNTER — Other Ambulatory Visit: Payer: Self-pay

## 2014-08-09 ENCOUNTER — Ambulatory Visit
Admission: RE | Admit: 2014-08-09 | Discharge: 2014-08-09 | Disposition: A | Discharge status: Home / Self Care | Payer: Non-veteran care | Source: Ambulatory Visit | Attending: Neurosurgery | Admitting: Neurosurgery

## 2014-08-09 ENCOUNTER — Other Ambulatory Visit: Payer: Self-pay | Admitting: Neurosurgery

## 2014-08-09 DIAGNOSIS — M4726 Other spondylosis with radiculopathy, lumbar region: Secondary | ICD-10-CM

## 2014-08-09 MED ORDER — METHYLPREDNISOLONE ACETATE 40 MG/ML INJ SUSP (RADIOLOG
120.0000 mg | Freq: Once | INTRAMUSCULAR | Status: AC
  Administered 2014-08-09: 120 mg via EPIDURAL

## 2014-08-09 MED ORDER — IOHEXOL 180 MG/ML  SOLN
1.0000 mL | Freq: Once | INTRAMUSCULAR | Status: AC | PRN
  Administered 2014-08-09: 1 mL via EPIDURAL

## 2014-08-09 NOTE — Discharge Instructions (Signed)

## 2014-12-09 ENCOUNTER — Encounter (HOSPITAL_COMMUNITY): Payer: Self-pay | Admitting: Emergency Medicine

## 2014-12-09 ENCOUNTER — Emergency Department (HOSPITAL_COMMUNITY)
Admission: EM | Admit: 2014-12-09 | Discharge: 2014-12-09 | Disposition: A | Discharge status: Home / Self Care | Payer: Non-veteran care | Attending: Emergency Medicine | Admitting: Emergency Medicine

## 2014-12-09 DIAGNOSIS — Z79899 Other long term (current) drug therapy: Secondary | ICD-10-CM | POA: Insufficient documentation

## 2014-12-09 DIAGNOSIS — G8929 Other chronic pain: Secondary | ICD-10-CM | POA: Insufficient documentation

## 2014-12-09 DIAGNOSIS — Z7982 Long term (current) use of aspirin: Secondary | ICD-10-CM | POA: Insufficient documentation

## 2014-12-09 DIAGNOSIS — E119 Type 2 diabetes mellitus without complications: Secondary | ICD-10-CM | POA: Insufficient documentation

## 2014-12-09 DIAGNOSIS — J029 Acute pharyngitis, unspecified: Secondary | ICD-10-CM | POA: Insufficient documentation

## 2014-12-09 DIAGNOSIS — E785 Hyperlipidemia, unspecified: Secondary | ICD-10-CM | POA: Insufficient documentation

## 2014-12-09 LAB — RAPID STREP SCREEN (MED CTR MEBANE ONLY): Streptococcus, Group A Screen (Direct): NEGATIVE

## 2014-12-09 MED ORDER — ACETAMINOPHEN 325 MG PO TABS
650.0000 mg | ORAL_TABLET | Freq: Once | ORAL | Status: AC
  Administered 2014-12-09: 650 mg via ORAL
  Filled 2014-12-09: qty 2

## 2014-12-09 NOTE — ED Provider Notes (Signed)
CSN: [id number]     Arrival date & time 12/09/14  [id number] History   First MD Initiated Contact with Patient 12/09/14 [phone]     Chief Complaint  Patient presents with  . Sore Throat  . Cough     (Consider location/radiation/quality/duration/timing/severity/associated sxs/prior Treatment) HPI Shawna Horton is a 54 y.o. female with chronic back pain, DM, presents. Emergency department complaining of sore throat. Patient states her symptoms started 4 days ago. She reports associated cough, but states cough was only there for 1 day and has since then resolved. She denies fever or chills. She denies difficulty swallowing. She denies voice change. She has been doing gargles and saline, and no other treatment prior to coming in. No chest pain or shortness of breath. No nausea, vomiting, diarrhea. No abdominal pain. No other complaints  Past Medical History  Diagnosis Date  . Back pain, chronic   . Diabetes mellitus without complication   . Hyperlipemia    History reviewed. No pertinent past surgical history. No family history on file. History  Substance Use Topics  . Smoking status: Never Smoker   . Smokeless tobacco: Not on file  . Alcohol Use: Yes   OB History    No data available     Review of Systems  HENT: Positive for sore throat. Negative for congestion.   Respiratory: Positive for cough.   All other systems reviewed and are negative.     Allergies  Review of patient's allergies indicates no known allergies.  Home Medications   Prior to Admission medications   Medication Sig Start Date End Date Taking? Authorizing Provider  albuterol (PROVENTIL HFA;VENTOLIN HFA) 108 (90 BASE) MCG/ACT inhaler Inhale 2 puffs into the lungs every 6 (six) hours as needed for wheezing or shortness of breath.    Historical Provider, MD  aspirin EC 81 MG tablet Take 81 mg by mouth daily.    Historical Provider, MD  ATORVASTATIN CALCIUM PO Take 1.5 tablets by mouth daily.    Historical Provider,  MD  cyclobenzaprine (FLEXERIL) 10 MG tablet Take 10 mg by mouth 3 (three) times daily as needed for muscle spasms.    Historical Provider, MD  diazepam (VALIUM) 5 MG tablet Take 1 tablet (5 mg total) by mouth every 8 (eight) hours as needed for muscle spasms. 03/15/14   Kristen N Ward, DO  ibuprofen (ADVIL,MOTRIN) 800 MG tablet Take 800 mg by mouth every 6 (six) hours as needed.     Historical Provider, MD  ibuprofen (ADVIL,MOTRIN) 800 MG tablet Take 1 tablet (800 mg total) by mouth every 8 (eight) hours as needed for mild pain. 03/15/14   Kristen N Ward, DO  metFORMIN (GLUCOPHAGE) 500 MG tablet Take 500 mg by mouth 2 (two) times daily with a meal.    Historical Provider, MD  oxyCODONE-acetaminophen (PERCOCET/ROXICET) 5-325 MG per tablet Take 1 tablet by mouth every 4 (four) hours as needed. 03/15/14   Kristen N Ward, DO  SERTRALINE HCL PO Take 1 tablet by mouth daily.    Historical Provider, MD  traMADol (ULTRAM) 50 MG tablet Take 100 mg by mouth every 6 (six) hours as needed for pain.    Historical Provider, MD  Vitamin D, Ergocalciferol, (DRISDOL) 50000 UNITS CAPS capsule Take 50,000 Units by mouth every 7 (seven) days.    Historical Provider, MD   BP 116/74 mmHg  Pulse 78  Temp(Src) 98.8 F (37.1 C) (Oral)  Resp 18  Ht 5\' 8"  (1.727 m)  Wt 250 lb (113.399  kg)  BMI 38.02 kg/m2  SpO2 98% Physical Exam  Constitutional: She appears well-developed and well-nourished. No distress.  HENT:  Head: Normocephalic.  Right Ear: External ear normal.  Left Ear: External ear normal.  Nose: Nose normal.  Oropharynx erythematous, tonsils not enlarged. No exudate. Uvula midline  Eyes: Conjunctivae are normal.  Neck: Neck supple.  Cardiovascular: Normal rate, regular rhythm and normal heart sounds.   Pulmonary/Chest: Effort normal and breath sounds normal. No respiratory distress. She has no wheezes. She has no rales.  Musculoskeletal: She exhibits no edema.  Neurological: She is alert.  Skin: Skin is  warm and dry.  Psychiatric: She has a normal mood and affect. Her behavior is normal.  Nursing note and vitals reviewed.   ED Course  Procedures (including critical care time) Labs Review Labs Reviewed  RAPID STREP SCREEN (NOT AT [facility])  CULTURE, GROUP A STREP    Imaging Review No results found.   EKG Interpretation None      MDM   Final diagnoses:  Pharyngitis    Pt with sore throat. Afebrile, non toxic appearing. Strep negative. VS normal. Most likely viral pharyngitis. Home with tylenol, salt water gargles. Follow up as needed.   Filed Vitals:   12/09/14 0742  BP: 116/74  Pulse: 78  Temp: 98.8 F (37.1 C)  TempSrc: Oral  Resp: 18  Height:  (1.727 m)  Weight: 250 lb (113.399 kg)  SpO2: 98%       Jaynie Crumble, PA-C 12/09/14 0910  Purvis Sheffield, MD 12/09/14 [phone]

## 2014-12-09 NOTE — ED Notes (Signed)
Kirichenko, PA at bedside for evaluation. 

## 2014-12-09 NOTE — ED Notes (Signed)
Patient states sore throat and productive cough since Sunday.   Patient denies other symptoms.

## 2014-12-09 NOTE — Discharge Instructions (Signed)
Tylenol for pain. Salt water gargles.  Your strep screen is negative. This is most likely a viral pharyngitis and will resolve on its own. Follow up with your primary care doctor.   Pharyngitis Pharyngitis is redness, pain, and swelling (inflammation) of your pharynx.  CAUSES  Pharyngitis is usually caused by infection. Most of the time, these infections are from viruses (viral) and are part of a cold. However, sometimes pharyngitis is caused by bacteria (bacterial). Pharyngitis can also be caused by allergies. Viral pharyngitis may be spread from person to person by coughing, sneezing, and personal items or utensils (cups, forks, spoons, toothbrushes). Bacterial pharyngitis may be spread from person to person by more intimate contact, such as kissing.  SIGNS AND SYMPTOMS  Symptoms of pharyngitis include:   Sore throat.   Tiredness (fatigue).   Low-grade fever.   Headache.  Joint pain and muscle aches.  Skin rashes.  Swollen lymph nodes.  Plaque-like film on throat or tonsils (often seen with bacterial pharyngitis). DIAGNOSIS  Your health care provider will ask you questions about your illness and your symptoms. Your medical history, along with a physical exam, is often all that is needed to diagnose pharyngitis. Sometimes, a rapid strep test is done. Other lab tests may also be done, depending on the suspected cause.  TREATMENT  Viral pharyngitis will usually get better in 3-4 days without the use of medicine. Bacterial pharyngitis is treated with medicines that kill germs (antibiotics).  HOME CARE INSTRUCTIONS   Drink enough water and fluids to keep your urine clear or pale yellow.   Only take over-the-counter or prescription medicines as directed by your health care provider:   If you are prescribed antibiotics, make sure you finish them even if you start to feel better.   Do not take aspirin.   Get lots of rest.   Gargle with 8 oz of salt water ( tsp of salt per  1 qt of water) as often as every 1-2 hours to soothe your throat.   Throat lozenges (if you are not at risk for choking) or sprays may be used to soothe your throat. SEEK MEDICAL CARE IF:   You have large, tender lumps in your neck.  You have a rash.  You cough up green, yellow-brown, or bloody spit. SEEK IMMEDIATE MEDICAL CARE IF:   Your neck becomes stiff.  You drool or are unable to swallow liquids.  You vomit or are unable to keep medicines or liquids down.  You have severe pain that does not go away with the use of recommended medicines.  You have trouble breathing (not caused by a stuffy nose). MAKE SURE YOU:   Understand these instructions.  Will watch your condition.  Will get help right away if you are not doing well or get worse. Document Released: 05/28/2005 Document Revised: 03/18/2013 Document Reviewed: 02/02/2013 [facility] Patient Information 2015 [place], [place]. This information is not intended to replace advice given to you by your health care provider. Make sure you discuss any questions you have with your health care provider.

## 2014-12-11 LAB — CULTURE, GROUP A STREP: STREP A CULTURE: NEGATIVE

## 2015-07-05 ENCOUNTER — Emergency Department (HOSPITAL_COMMUNITY)
Admission: EM | Admit: 2015-07-05 | Discharge: 2015-07-05 | Disposition: A | Discharge status: Home / Self Care | Payer: Non-veteran care | Attending: Emergency Medicine | Admitting: Emergency Medicine

## 2015-07-05 ENCOUNTER — Encounter (HOSPITAL_COMMUNITY): Payer: Self-pay

## 2015-07-05 DIAGNOSIS — E119 Type 2 diabetes mellitus without complications: Secondary | ICD-10-CM | POA: Insufficient documentation

## 2015-07-05 DIAGNOSIS — Z7982 Long term (current) use of aspirin: Secondary | ICD-10-CM | POA: Insufficient documentation

## 2015-07-05 DIAGNOSIS — Z79899 Other long term (current) drug therapy: Secondary | ICD-10-CM | POA: Diagnosis not present

## 2015-07-05 DIAGNOSIS — E785 Hyperlipidemia, unspecified: Secondary | ICD-10-CM | POA: Diagnosis not present

## 2015-07-05 DIAGNOSIS — Z7984 Long term (current) use of oral hypoglycemic drugs: Secondary | ICD-10-CM | POA: Diagnosis not present

## 2015-07-05 DIAGNOSIS — G8929 Other chronic pain: Secondary | ICD-10-CM | POA: Insufficient documentation

## 2015-07-05 DIAGNOSIS — M25511 Pain in right shoulder: Secondary | ICD-10-CM | POA: Diagnosis present

## 2015-07-05 HISTORY — DX: Bursopathy, unspecified: M71.9

## 2015-07-05 MED ORDER — NAPROXEN 500 MG PO TABS
500.0000 mg | ORAL_TABLET | Freq: Once | ORAL | Status: AC
  Administered 2015-07-05: 500 mg via ORAL
  Filled 2015-07-05: qty 1

## 2015-07-05 MED ORDER — NAPROXEN 500 MG PO TABS: 500.0000 mg | ORAL_TABLET | Freq: Two times a day (BID) | ORAL | Status: DC

## 2015-07-05 NOTE — ED Notes (Signed)
Patient c/o chronic right shoulder pain. patient states she has a history of Bursitis in the right shoulder and is unable to sleep on the right.

## 2015-07-05 NOTE — Discharge Instructions (Signed)
Please take your medications as prescribed and as we discussed. This medication should help with the inflammation in your shoulder and subsequently the pain. Follow up with your doctor next week for reevaluation. Return to ED for any new or worsening symptoms as discussed.  Joint Pain Joint pain, which is also called arthralgia, can be caused by many things. Joint pain often goes away when you follow your health care provider's instructions for relieving pain at home. However, joint pain can also be caused by conditions that require further treatment. Common causes of joint pain include:  Bruising in the area of the joint.  Overuse of the joint.  Wear and tear on the joints that occur with aging (osteoarthritis).  Various other forms of arthritis.  A buildup of a crystal form of uric acid in the joint (gout).  Infections of the joint (septic arthritis) or of the bone (osteomyelitis). Your health care provider may recommend medicine to help with the pain. If your joint pain continues, additional tests may be needed to diagnose your condition. HOME CARE INSTRUCTIONS Watch your condition for any changes. Follow these instructions as directed to lessen the pain that you are feeling.  Take medicines only as directed by your health care provider.  Rest the affected area for as long as your health care provider says that you should. If directed to do so, raise the painful joint above the level of your heart while you are sitting or lying down.  Do not do things that cause or worsen pain.  If directed, apply ice to the painful area:  Put ice in a plastic bag.  Place a towel between your skin and the bag.  Leave the ice on for 20 minutes, 2-3 times per day.  Wear an elastic bandage, splint, or sling as directed by your health care provider. Loosen the elastic bandage or splint if your fingers or toes become numb and tingle, or if they turn cold and blue.  Begin exercising or stretching the  affected area as directed by your health care provider. Ask your health care provider what types of exercise are safe for you.  Keep all follow-up visits as directed by your health care provider. This is important. SEEK MEDICAL CARE IF:  Your pain increases, and medicine does not help.  Your joint pain does not improve within 3 days.  You have increased bruising or swelling.  You have a fever.  You lose 10 lb (4.5 kg) or more without trying. SEEK IMMEDIATE MEDICAL CARE IF:  You are not able to move the joint.  Your fingers or toes become numb or they turn cold and blue.   This information is not intended to replace advice given to you by your health care provider. Make sure you discuss any questions you have with your health care provider.   Document Released: 05/28/2005 Document Revised: 06/18/2014 Document Reviewed: 03/09/2014 [employer] Interactive Patient Education [employer]

## 2015-07-05 NOTE — ED Provider Notes (Signed)
CSN: [id number]     Arrival date & time 07/05/15  1004 History   First MD Initiated Contact with Patient 07/05/15 1110     Chief Complaint  Patient presents with  . Shoulder Pain     (Consider location/radiation/quality/duration/timing/severity/associated sxs/prior Treatment) HPI Shawna Horton is a 55 y.o. female with a history of chronic back pain, comes in for evaluation of shoulder pain. Patient reports she has had ongoing shoulder pain for the past 6 months. She reports she was diagnosed with bursitis approximately 3 weeks ago at the [place]. She does report an upcoming appointment at the [facility] next week or she will be able to follow-up. She reports she has been taking tizanidine without relief of her symptoms. She does report constant, dull ache in her shoulder. Worse at night while trying to sleep and with certain movements. Nothing seems to make it better. No other modifying factors. Denies fevers, chills, numbness or weakness, chest pain, shortness of breath, rash.  Past Medical History  Diagnosis Date  . Back pain, chronic   . Diabetes mellitus without complication (HCC)   . Hyperlipemia   . Bursitis    Past Surgical History  Procedure Laterality Date  . Foot surgery Left    Family History  Problem Relation Age of Onset  . Crohn's disease Mother   . Hypertension Father    Social History  Substance Use Topics  . Smoking status: Never Smoker   . Smokeless tobacco: None  . Alcohol Use: Yes     Comment: weekends   OB History    No data available     Review of Systems A 10 point review of systems was completed and was negative except for pertinent positives and negatives as mentioned in the history of present illness     Allergies  Review of patient's allergies indicates no known allergies.  Home Medications   Prior to Admission medications   Medication Sig Start Date End Date Taking? Authorizing Provider  albuterol (PROVENTIL HFA;VENTOLIN HFA) 108 (90 BASE) MCG/ACT  inhaler Inhale 2 puffs into the lungs every 6 (six) hours as needed for wheezing or shortness of breath.    Historical Provider, MD  aspirin EC 81 MG tablet Take 81 mg by mouth daily.    Historical Provider, MD  ATORVASTATIN CALCIUM PO Take 1.5 tablets by mouth daily.    Historical Provider, MD  cyclobenzaprine (FLEXERIL) 10 MG tablet Take 10 mg by mouth 3 (three) times daily as needed for muscle spasms.    Historical Provider, MD  diazepam (VALIUM) 5 MG tablet Take 1 tablet (5 mg total) by mouth every 8 (eight) hours as needed for muscle spasms. 03/15/14   Kristen N Ward, DO  ibuprofen (ADVIL,MOTRIN) 800 MG tablet Take 800 mg by mouth every 6 (six) hours as needed.     Historical Provider, MD  ibuprofen (ADVIL,MOTRIN) 800 MG tablet Take 1 tablet (800 mg total) by mouth every 8 (eight) hours as needed for mild pain. 03/15/14   Kristen N Ward, DO  metFORMIN (GLUCOPHAGE) 500 MG tablet Take 500 mg by mouth 2 (two) times daily with a meal.    Historical Provider, MD  naproxen (NAPROSYN) 500 MG tablet Take 1 tablet (500 mg total) by mouth 2 (two) times daily. 07/05/15   Joycie Peek, PA-C  oxyCODONE-acetaminophen (PERCOCET/ROXICET) 5-325 MG per tablet Take 1 tablet by mouth every 4 (four) hours as needed. 03/15/14   Kristen N Ward, DO  SERTRALINE HCL PO Take 1 tablet by  mouth daily.    Historical Provider, MD  traMADol (ULTRAM) 50 MG tablet Take 100 mg by mouth every 6 (six) hours as needed for pain.    Historical Provider, MD  Vitamin D, Ergocalciferol, (DRISDOL) 50000 UNITS CAPS capsule Take 50,000 Units by mouth every 7 (seven) days.    Historical Provider, MD   BP 127/84 mmHg  Pulse 81  Temp(Src) 98.2 F (36.8 C) (Oral)  Resp 17  SpO2 98% Physical Exam  Constitutional:  Awake, alert, nontoxic appearance. Well appearing African-American female  HENT:  Head: Atraumatic.  Eyes: Right eye exhibits no discharge. Left eye exhibits no discharge.  Neck: Neck supple.  Pulmonary/Chest: Effort normal.  She exhibits no tenderness.  Abdominal: Soft. There is no tenderness. There is no rebound.  Musculoskeletal: She exhibits no tenderness.  Right shoulder without any focal tenderness. No erythema, warmth, swelling. Active range of motion is limited in all directions secondary to pain. No bony crepitus. Maintains full passive range of motion. Full active range of motion of right elbow and wrist. Distal pulses are intact. Sensation intact.  Neurological:  Mental status and motor strength appears baseline for patient and situation.  Skin: No rash noted.  Psychiatric: She has a normal mood and affect.  Nursing note and vitals reviewed.   ED Course  Procedures (including critical care time) Labs Review Labs Reviewed - No data to display  Imaging Review No results found. I have personally reviewed and evaluated these images and lab results as part of my medical decision-making.   EKG Interpretation None     Meds given in ED:  Medications - No data to display  New Prescriptions   NAPROXEN (NAPROSYN) 500 MG TABLET    Take 1 tablet (500 mg total) by mouth 2 (two) times daily.   Filed Vitals:   07/05/15 1011  BP: 127/84  Pulse: 81  Temp: 98.2 F (36.8 C)  TempSrc: Oral  Resp: 17  SpO2: 98%    MDM  Shawna Horton is a 55 y.o. female with a history of chronic back pain, who comes in for evaluation of right shoulder pain. Symptoms have been ongoing for 6 months. [facility] Discussed may need further evaluation by orthopedist for shoulder pain. Exam consistent with possible rotator cuff tendinitis. Maintains full passive range of motion, neurovascularly intact. No evidence of hemarthrosis, septic joint, fracture, infection or other vascular compromise. Will DC with short course trial of naproxen. Discussed return precautions. Patient verbalizes understanding and agrees with this plan. Overall appears well, nontoxic, hemodynamically stable and  appropriate for discharge. Final diagnoses:  Right shoulder pain       Joycie Peek, PA-C 07/05/15 1140  Pricilla Loveless, MD 07/07/15 1015

## 2015-08-30 ENCOUNTER — Emergency Department (HOSPITAL_COMMUNITY): Payer: Non-veteran care

## 2015-08-30 ENCOUNTER — Encounter (HOSPITAL_COMMUNITY): Payer: Self-pay | Admitting: Emergency Medicine

## 2015-08-30 ENCOUNTER — Emergency Department (HOSPITAL_COMMUNITY)
Admission: EM | Admit: 2015-08-30 | Discharge: 2015-08-31 | Disposition: A | Discharge status: Home / Self Care | Payer: Non-veteran care | Attending: Emergency Medicine | Admitting: Emergency Medicine

## 2015-08-30 DIAGNOSIS — Z7984 Long term (current) use of oral hypoglycemic drugs: Secondary | ICD-10-CM | POA: Insufficient documentation

## 2015-08-30 DIAGNOSIS — R1032 Left lower quadrant pain: Secondary | ICD-10-CM | POA: Diagnosis not present

## 2015-08-30 DIAGNOSIS — R112 Nausea with vomiting, unspecified: Secondary | ICD-10-CM | POA: Diagnosis not present

## 2015-08-30 DIAGNOSIS — E119 Type 2 diabetes mellitus without complications: Secondary | ICD-10-CM | POA: Insufficient documentation

## 2015-08-30 DIAGNOSIS — G8929 Other chronic pain: Secondary | ICD-10-CM | POA: Insufficient documentation

## 2015-08-30 DIAGNOSIS — Z7982 Long term (current) use of aspirin: Secondary | ICD-10-CM | POA: Diagnosis not present

## 2015-08-30 DIAGNOSIS — R197 Diarrhea, unspecified: Secondary | ICD-10-CM | POA: Diagnosis not present

## 2015-08-30 DIAGNOSIS — Z8739 Personal history of other diseases of the musculoskeletal system and connective tissue: Secondary | ICD-10-CM | POA: Diagnosis not present

## 2015-08-30 DIAGNOSIS — E785 Hyperlipidemia, unspecified: Secondary | ICD-10-CM | POA: Diagnosis not present

## 2015-08-30 DIAGNOSIS — Z79899 Other long term (current) drug therapy: Secondary | ICD-10-CM | POA: Diagnosis not present

## 2015-08-30 LAB — LIPASE, BLOOD: Lipase: 27 U/L (ref 11–51)

## 2015-08-30 LAB — URINALYSIS, ROUTINE W REFLEX MICROSCOPIC
BILIRUBIN URINE: NEGATIVE
Glucose, UA: NEGATIVE mg/dL
Hgb urine dipstick: NEGATIVE
KETONES UR: NEGATIVE mg/dL
LEUKOCYTES UA: NEGATIVE
NITRITE: NEGATIVE
Protein, ur: NEGATIVE mg/dL
Specific Gravity, Urine: 1.028 (ref 1.005–1.030)
pH: 6 (ref 5.0–8.0)

## 2015-08-30 LAB — COMPREHENSIVE METABOLIC PANEL
ALBUMIN: 4 g/dL (ref 3.5–5.0)
ALK PHOS: 92 U/L (ref 38–126)
ALT: 34 U/L (ref 14–54)
ANION GAP: 9 (ref 5–15)
AST: 22 U/L (ref 15–41)
BILIRUBIN TOTAL: 0.9 mg/dL (ref 0.3–1.2)
BUN: 11 mg/dL (ref 6–20)
CALCIUM: 8.9 mg/dL (ref 8.9–10.3)
CO2: 25 mmol/L (ref 22–32)
Chloride: 109 mmol/L (ref 101–111)
Creatinine, Ser: 0.78 mg/dL (ref 0.44–1.00)
GFR calc Af Amer: >60 mL/min (ref >60)
GLUCOSE: 127 mg/dL — AB (ref 65–99)
POTASSIUM: 3.3 mmol/L — AB (ref 3.5–5.1)
Sodium: 143 mmol/L (ref 135–145)
TOTAL PROTEIN: 7.7 g/dL (ref 6.5–8.1)

## 2015-08-30 LAB — CBC
HEMATOCRIT: 40.5 % (ref 36.0–46.0)
HEMOGLOBIN: 12.9 g/dL (ref 12.0–15.0)
MCH: 25.8 pg — ABNORMAL LOW (ref 26.0–34.0)
MCHC: 31.9 g/dL (ref 30.0–36.0)
MCV: 81 fL (ref 78.0–100.0)
Platelets: 327 10*3/uL (ref 150–400)
RBC: 5 MIL/uL (ref 3.87–5.11)
RDW: 17 % — AB (ref 11.5–15.5)
WBC: 6.8 10*3/uL (ref 4.0–10.5)

## 2015-08-30 MED ORDER — ONDANSETRON 4 MG PO TBDP
4.0000 mg | ORAL_TABLET | Freq: Once | ORAL | Status: AC | PRN
  Administered 2015-08-30: 4 mg via ORAL
  Filled 2015-08-30: qty 1

## 2015-08-30 MED ORDER — IOHEXOL 300 MG/ML  SOLN
25.0000 mL | Freq: Once | INTRAMUSCULAR | Status: AC | PRN
  Administered 2015-08-30: 25 mL via ORAL

## 2015-08-30 MED ORDER — POTASSIUM CHLORIDE CRYS ER 20 MEQ PO TBCR
20.0000 meq | EXTENDED_RELEASE_TABLET | Freq: Once | ORAL | Status: AC
  Administered 2015-08-30: 20 meq via ORAL
  Filled 2015-08-30: qty 1

## 2015-08-30 MED ORDER — IOPAMIDOL (ISOVUE-300) INJECTION 61%
100.0000 mL | Freq: Once | INTRAVENOUS | Status: AC | PRN
  Administered 2015-08-30: 100 mL via INTRAVENOUS

## 2015-08-30 MED ORDER — ACETAMINOPHEN 500 MG PO TABS
1000.0000 mg | ORAL_TABLET | Freq: Once | ORAL | Status: AC
  Administered 2015-08-30: 1000 mg via ORAL
  Filled 2015-08-30: qty 2

## 2015-08-30 MED ORDER — SODIUM CHLORIDE 0.9 % IV BOLUS (SEPSIS)
1000.0000 mL | Freq: Once | INTRAVENOUS | Status: AC
  Administered 2015-08-30: 1000 mL via INTRAVENOUS

## 2015-08-30 NOTE — ED Notes (Signed)
Pt states she has been having NVD x 1 wk.  Denies pain.

## 2015-08-30 NOTE — ED Notes (Signed)
Requested patient to urinate. 

## 2015-08-30 NOTE — ED Provider Notes (Signed)
CSN: [id number]     Arrival date & time 08/30/15  1530 History   First MD Initiated Contact with Patient 08/30/15 2204     Chief Complaint  Patient presents with  . Nausea  . Emesis  . Diarrhea     (Consider location/radiation/quality/duration/timing/severity/associated sxs/prior Treatment) HPI 55 year old female with history of diabetes who presents with nausea, vomiting, and diarrhea for one week. States initially started having nausea and vomiting, which is resolved over the past 1-2 days. Has been having 4-5 episodes of nonbloody diarrhea over the past several days. States that this is associated with left lower quadrant abdominal pain, subjective fevers and chills. No dysuria, urinary frequency, flank pain. Also having some congestion, sore throat and runny nose with cough. Unknown sick contacts. No recent travel or antibiotics. No chest pain or difficulty breathing. Past Medical History  Diagnosis Date  . Back pain, chronic   . Diabetes mellitus without complication (HCC)   . Hyperlipemia   . Bursitis    Past Surgical History  Procedure Laterality Date  . Foot surgery Left    Family History  Problem Relation Age of Onset  . Crohn's disease Mother   . Hypertension Father    Social History  Substance Use Topics  . Smoking status: Never Smoker   . Smokeless tobacco: None  . Alcohol Use: Yes     Comment: weekends   OB History    No data available     Review of Systems 10/14 systems reviewed and are negative other than those stated in the HPI    Allergies  Review of patient's allergies indicates no known allergies.  Home Medications   Prior to Admission medications   Medication Sig Start Date End Date Taking? Authorizing Provider  albuterol (PROVENTIL HFA;VENTOLIN HFA) 108 (90 BASE) MCG/ACT inhaler Inhale 2 puffs into the lungs every 6 (six) hours as needed for wheezing or shortness of breath.   Yes Historical Provider, MD  aspirin EC 81 MG tablet Take 81 mg by  mouth daily.   Yes Historical Provider, MD  ATORVASTATIN CALCIUM PO Take 1.5 tablets by mouth daily.   Yes Historical Provider, MD  cyclobenzaprine (FLEXERIL) 10 MG tablet Take 10 mg by mouth 3 (three) times daily as needed for muscle spasms.   Yes Historical Provider, MD  ibuprofen (ADVIL,MOTRIN) 800 MG tablet Take 1 tablet (800 mg total) by mouth every 8 (eight) hours as needed for mild pain. Patient taking differently: Take 800 mg by mouth every 6 (six) hours as needed for mild pain or moderate pain.  03/15/14  Yes Kristen N Ward, DO  metFORMIN (GLUCOPHAGE) 500 MG tablet Take 500 mg by mouth 2 (two) times daily with a meal.   Yes Historical Provider, MD  omeprazole (PRILOSEC) 20 MG capsule Take 20 mg by mouth 2 (two) times daily.   Yes Historical Provider, MD  pravastatin (PRAVACHOL) 80 MG tablet Take 80 mg by mouth daily.   Yes Historical Provider, MD  SERTRALINE HCL PO Take 1 tablet by mouth daily.   Yes Historical Provider, MD  diazepam (VALIUM) 5 MG tablet Take 1 tablet (5 mg total) by mouth every 8 (eight) hours as needed for muscle spasms. Patient not taking: Reported on 08/30/2015 03/15/14   Layla MawKristen N Ward, DO  loperamide (IMODIUM) 2 MG capsule Take 1 capsule (2 mg total) by mouth 4 (four) times daily as needed for diarrhea or loose stools. 08/31/15   Lavera Guiseana Duo Amaree Leeper, MD  naproxen (NAPROSYN) 500 MG tablet Take  1 tablet (500 mg total) by mouth 2 (two) times daily. Patient not taking: Reported on 08/30/2015 07/05/15   Joycie Peek, PA-C  ondansetron (ZOFRAN) 4 MG tablet Take 1 tablet (4 mg total) by mouth every 6 (six) hours. 08/31/15   Lavera Guise, MD  oxyCODONE-acetaminophen (PERCOCET/ROXICET) 5-325 MG per tablet Take 1 tablet by mouth every 4 (four) hours as needed. Patient not taking: Reported on 08/30/2015 03/15/14   Kristen N Ward, DO   BP 145/89 mmHg  Pulse 90  Temp(Src) 99.9 F (37.7 C) (Oral)  Resp 18  Ht 5' 8.5" (1.74 m)  Wt 260 lb (117.935 kg)  BMI 38.95 kg/m2  SpO2  97% Physical Exam Physical Exam  Nursing note and vitals reviewed. Constitutional: Well developed, well nourished, non-toxic, and in no acute distress Head: Normocephalic and atraumatic.  Mouth/Throat: Oropharynx is clear and moist.  Neck: Normal range of motion. Neck supple.  Cardiovascular: Normal rate and regular rhythm.   Pulmonary/Chest: Effort normal and breath sounds normal.  Abdominal: Soft. NO distension. There is some LLQ tenderness. There is no rebound and no guarding.  Musculoskeletal: Normal range of motion.  Neurological: Alert, no facial droop, fluent speech, moves all extremities symmetrically Skin: Skin is warm and dry.  Psychiatric: Cooperative  ED Course  Procedures (including critical care time) Labs Review Labs Reviewed  COMPREHENSIVE METABOLIC PANEL - Abnormal; Notable for the following:    Potassium 3.3 (*)    Glucose, Bld 127 (*)    All other components within normal limits  CBC - Abnormal; Notable for the following:    MCH 25.8 (*)    RDW 17.0 (*)    All other components within normal limits  URINALYSIS, ROUTINE W REFLEX MICROSCOPIC (NOT AT [facility]) - Abnormal; Notable for the following:    Color, Urine AMBER (*)    APPearance CLOUDY (*)    All other components within normal limits  LIPASE, BLOOD    Imaging Review Dg Chest 2 View  08/30/2015  CLINICAL DATA:  55 year old female with productive cough and fever EXAM: CHEST  2 VIEW COMPARISON:  Radiograph dated 08/30/2009 FINDINGS: Two views of the chest do not demonstrate a focal consolidation. There is no pleural effusion or pneumothorax. The cardiac silhouette is within normal limits. No acute osseous pathology. IMPRESSION: No active cardiopulmonary disease. Electronically Signed   By: Elgie Collard M.D.   On: 08/30/2015 22:52   Ct Abdomen Pelvis W Contrast  08/31/2015  CLINICAL DATA:  Nausea, vomiting, and diarrhea for 1 week. Fever and left lower quadrant pain. EXAM: CT ABDOMEN AND PELVIS WITH  CONTRAST TECHNIQUE: Multidetector CT imaging of the abdomen and pelvis was performed using the standard protocol following bolus administration of intravenous contrast. CONTRAST:  25mL OMNIPAQUE IOHEXOL 300 MG/ML SOLN, ISOVUE-300 IOPAMIDOL (ISOVUE-300) INJECTION 61% COMPARISON:  None. FINDINGS: The lung bases are clear. Mild diffuse fatty infiltration of the liver. The gallbladder, pancreas, spleen, adrenal glands, kidneys, abdominal aorta, inferior vena cava, and retroperitoneal lymph nodes are unremarkable. Stomach, small bowel, and colon are not abnormally distended. No free air or free fluid in the abdomen. Contrast material flows to the colon without evidence of small bowel obstruction. No bowel wall thickening is appreciated. Pelvis: Appendix is normal. Uterus and ovaries are not enlarged. No free or loculated pelvic fluid collections. No pelvic mass or lymphadenopathy. Bladder is decompressed. No evidence of diverticulitis. Degenerative changes in the spine. No destructive bone lesions. IMPRESSION: No acute process demonstrated in the abdomen or pelvis. No  evidence of bowel obstruction or inflammation. Diffuse fatty infiltration of the liver. Electronically Signed   By: Burman Nieves M.D.   On: 08/31/2015 00:09   I have personally reviewed and evaluated these images and lab results as part of my medical decision-making.   EKG Interpretation None      MDM   Final diagnoses:  Nausea vomiting and diarrhea    55 year old female who presents with one week of nausea, vomiting, diarrhea. With low-grade fever on presentation but otherwise stable vital signs. On my repeat temperature obtained in the room she has a temperature of 101.6. She appears well-hydrated. Has an overall soft and nonsurgical abdomen. She does have some left lower quadrant tenderness to palpation she states. Basic blood work reveals unremarkable CBC, comp metabolic profile, lipase and urinalysis. Given her left lower  quadrant abdominal pain with fever CT abdomen and pelvis was performed.Visualized and negative for acute intraabdominal processes. Presentation consistent with that of a viral illness. Discussed continue supportive care for home. Strict return and follow-up instructions are reviewed. She expressed understanding of all discharge instructions, and felt comfortable with the plan of care    Lavera Guise, MD 08/31/15 0021

## 2015-08-30 NOTE — ED Notes (Signed)
Patient finished contrast

## 2015-08-31 MED ORDER — ONDANSETRON HCL 4 MG PO TABS: 4.0000 mg | ORAL_TABLET | Freq: Four times a day (QID) | ORAL | Status: DC

## 2015-08-31 MED ORDER — LOPERAMIDE HCL 2 MG PO CAPS: 2.0000 mg | ORAL_CAPSULE | Freq: Four times a day (QID) | ORAL | Status: DC | PRN

## 2015-08-31 MED ORDER — LOPERAMIDE HCL 2 MG PO CAPS
2.0000 mg | ORAL_CAPSULE | Freq: Once | ORAL | Status: AC
  Administered 2015-08-31: 2 mg via ORAL
  Filled 2015-08-31: qty 1

## 2015-08-31 NOTE — Discharge Instructions (Signed)
Continuing to keep well-hydrated and well-nourished. Take antidiarrheal and antinausea medication as needed only. Return for worsening symptoms including confusion, worsening pain, or any other symptoms concerning to you. Your CT scan today does not show bowel infection requiring antibiotics.   Diarrhea Diarrhea is watery poop (stool). It can make you feel weak, tired, thirsty, or give you a dry mouth (signs of dehydration). Watery poop is a sign of another problem, most often an infection. It often lasts 2-3 days. It can last longer if it is a sign of something serious. Take care of yourself as told by your doctor. HOME CARE   Drink 1 cup (8 ounces) of fluid each time you have watery poop.  Do not drink the following fluids:  Those that contain simple sugars (fructose, glucose, galactose, lactose, sucrose, maltose).  Sports drinks.  Fruit juices.  Whole milk products.  Sodas.  Drinks with caffeine (coffee, tea, soda) or alcohol.  Oral rehydration solution may be used if the doctor says it is okay. You may make your own solution. Follow this recipe:   - teaspoon table salt.   teaspoon baking soda.   teaspoon salt substitute containing potassium chloride.  1 tablespoons sugar.  1 liter (34 ounces) of water.  Avoid the following foods:  High fiber foods, such as raw fruits and vegetables.  Nuts, seeds, and whole grain breads and cereals.   Those that are sweetened with sugar alcohols (xylitol, sorbitol, mannitol).  Try eating the following foods:  Starchy foods, such as rice, toast, pasta, low-sugar cereal, oatmeal, baked potatoes, crackers, and bagels.  Bananas.  Applesauce.  Eat probiotic-rich foods, such as yogurt and milk products that are fermented.  Wash your hands well after each time you have watery poop.  Only take medicine as told by your doctor.  Take a warm bath to help lessen burning or pain from having watery poop. GET HELP RIGHT AWAY IF:   You  cannot drink fluids without throwing up (vomiting).  You keep throwing up.  You have blood in your poop, or your poop looks black and tarry.  You do not pee (urinate) in 6-8 hours, or there is only a small amount of very dark pee.  You have belly (abdominal) pain that gets worse or stays in the same spot (localizes).  You are weak, dizzy, confused, or light-headed.  You have a very bad headache.  Your watery poop gets worse or does not get better.  You have a fever or lasting symptoms for more than 2-3 days.  You have a fever and your symptoms suddenly get worse. MAKE SURE YOU:   Understand these instructions.  Will watch your condition.  Will get help right away if you are not doing well or get worse.   This information is not intended to replace advice given to you by your health care provider. Make sure you discuss any questions you have with your health care provider.   Document Released: 11/14/2007 Document Revised: 06/18/2014 Document Reviewed: 02/03/2012 Elsevier Interactive Patient Education 2016 [employer]  Nausea and Vomiting Nausea is a sick feeling that often comes before throwing up (vomiting). Vomiting is a reflex where stomach contents come out of your mouth. Vomiting can cause severe loss of body fluids (dehydration). Children and elderly adults can become dehydrated quickly, especially if they also have diarrhea. Nausea and vomiting are symptoms of a condition or disease. It is important to find the cause of your symptoms. CAUSES   Direct irritation of the  stomach lining. This irritation can result from increased acid production (gastroesophageal reflux disease), infection, food poisoning, taking certain medicines (such as nonsteroidal anti-inflammatory drugs), alcohol use, or tobacco use.  Signals from the brain.These signals could be caused by a headache, heat exposure, an inner ear disturbance, increased pressure in the brain from injury, infection, a  tumor, or a concussion, pain, emotional stimulus, or metabolic problems.  An obstruction in the gastrointestinal tract (bowel obstruction).  Illnesses such as diabetes, hepatitis, gallbladder problems, appendicitis, kidney problems, cancer, sepsis, atypical symptoms of a heart attack, or eating disorders.  Medical treatments such as chemotherapy and radiation.  Receiving medicine that makes you sleep (general anesthetic) during surgery. DIAGNOSIS Your caregiver may ask for tests to be done if the problems do not improve after a few days. Tests may also be done if symptoms are severe or if the reason for the nausea and vomiting is not clear. Tests may include:  Urine tests.  Blood tests.  Stool tests.  Cultures (to look for evidence of infection).  X-rays or other imaging studies. Test results can help your caregiver make decisions about treatment or the need for additional tests. TREATMENT You need to stay well hydrated. Drink frequently but in small amounts.You may wish to drink water, sports drinks, clear broth, or eat frozen ice pops or gelatin dessert to help stay hydrated.When you eat, eating slowly may help prevent nausea.There are also some antinausea medicines that may help prevent nausea. HOME CARE INSTRUCTIONS   Take all medicine as directed by your caregiver.  If you do not have an appetite, do not force yourself to eat. However, you must continue to drink fluids.  If you have an appetite, eat a normal diet unless your caregiver tells you differently.  Eat a variety of complex carbohydrates (rice, wheat, potatoes, bread), lean meats, yogurt, fruits, and vegetables.  Avoid high-fat foods because they are more difficult to digest.  Drink enough water and fluids to keep your urine clear or pale yellow.  If you are dehydrated, ask your caregiver for specific rehydration instructions. Signs of dehydration may include:  Severe thirst.  Dry lips and  mouth.  Dizziness.  Dark urine.  Decreasing urine frequency and amount.  Confusion.  Rapid breathing or pulse. SEEK IMMEDIATE MEDICAL CARE IF:   You have blood or brown flecks (like coffee grounds) in your vomit.  You have black or bloody stools.  You have a severe headache or stiff neck.  You are confused.  You have severe abdominal pain.  You have chest pain or trouble breathing.  You do not urinate at least once every 8 hours.  You develop cold or clammy skin.  You continue to vomit for longer than 24 to 48 hours.  You have a fever. MAKE SURE YOU:   Understand these instructions.  Will watch your condition.  Will get help right away if you are not doing well or get worse.   This information is not intended to replace advice given to you by your health care provider. Make sure you discuss any questions you have with your health care provider.   Document Released: 05/28/2005 Document Revised: 08/20/2011 Document Reviewed: 10/25/2010 Elsevier Interactive Patient Education [employer]

## 2016-05-05 ENCOUNTER — Emergency Department (HOSPITAL_COMMUNITY)
Admission: EM | Admit: 2016-05-05 | Discharge: 2016-05-05 | Disposition: A | Discharge status: Home / Self Care | Payer: Non-veteran care | Attending: Emergency Medicine | Admitting: Emergency Medicine

## 2016-05-05 ENCOUNTER — Emergency Department (HOSPITAL_COMMUNITY): Payer: Non-veteran care

## 2016-05-05 ENCOUNTER — Encounter (HOSPITAL_COMMUNITY): Payer: Self-pay | Admitting: Emergency Medicine

## 2016-05-05 DIAGNOSIS — R058 Other specified cough: Secondary | ICD-10-CM

## 2016-05-05 DIAGNOSIS — E119 Type 2 diabetes mellitus without complications: Secondary | ICD-10-CM | POA: Insufficient documentation

## 2016-05-05 DIAGNOSIS — Z7984 Long term (current) use of oral hypoglycemic drugs: Secondary | ICD-10-CM | POA: Diagnosis not present

## 2016-05-05 DIAGNOSIS — Z7982 Long term (current) use of aspirin: Secondary | ICD-10-CM | POA: Insufficient documentation

## 2016-05-05 DIAGNOSIS — J40 Bronchitis, not specified as acute or chronic: Secondary | ICD-10-CM | POA: Insufficient documentation

## 2016-05-05 DIAGNOSIS — R05 Cough: Secondary | ICD-10-CM

## 2016-05-05 MED ORDER — AZITHROMYCIN 250 MG PO TABS: 250.0000 mg | ORAL_TABLET | Freq: Every day | ORAL | 0 refills | Status: DC

## 2016-05-05 MED ORDER — BENZONATATE 100 MG PO CAPS: 100.0000 mg | ORAL_CAPSULE | Freq: Three times a day (TID) | ORAL | 0 refills | Status: DC | PRN

## 2016-05-05 MED ORDER — ALBUTEROL SULFATE HFA 108 (90 BASE) MCG/ACT IN AERS: 1.0000 | INHALATION_SPRAY | Freq: Four times a day (QID) | RESPIRATORY_TRACT | 0 refills | Status: AC | PRN

## 2016-05-05 NOTE — ED Triage Notes (Signed)
Patient here from home with complaints of persistant cough for 2 weeks. Chest congestion. OTC medications with no relief.

## 2016-05-05 NOTE — ED Notes (Signed)
Patient transported to X-ray 

## 2016-05-05 NOTE — Discharge Instructions (Signed)
It was my pleasure taking care of you today!  Please take all of your antibiotics until finished! Tessalon as needed for cough.  It is important to cut back on your cigarette smoking. This will help you get to feeling better quicker.  Increase hydration.  Follow up with your primary care physician if symptoms persist longer than 4-5 days.  Return to ER for high fevers, new or worsening symptoms, any additional concerns.

## 2016-05-05 NOTE — ED Provider Notes (Signed)
[facility]-EMERGENCY DEPT Provider Note   CSN: [id number] Arrival date & time: 05/05/16  0930     History   Chief Complaint Chief Complaint  Patient presents with  . Cough    HPI Shawna Horton is a 55 y.o. female.  The history is provided by the patient and medical records. No language interpreter was used.  Cough  Associated symptoms include rhinorrhea. Pertinent negatives include no chills, no headaches, no sore throat and no shortness of breath.    Shawna Horton is a 55 y.o. female  with a PMH of asthma, HTN, DM who presents to the Emergency Department complaining of worsening cough x 3 weeks. Initially was dry, but has become productive over the last few days. She has tried Mucinex, Alka-Seltzer cold and honey in her tea with no relief. She has a history of asthma and has an inhaler at home but unfortunately it was expired, therefore she did not use it. She smokes 2-3 cigarettes per day. She denies chest pain, shortness of breath, fevers, sore throat, abdominal pain, nausea, vomiting.  Past Medical History:  Diagnosis Date  . Back pain, chronic   . Bursitis   . Diabetes mellitus without complication (HCC)   . Hyperlipemia     Patient Active Problem List   Diagnosis Date Noted  . ONYCHOMYCOSIS, TOENAILS 08/15/2006  . GERD 08/15/2006  . OBESITY, MORBID 06/17/2006  . ALCOHOL ABUSE 06/17/2006  . HYPERLIPIDEMIA 04/19/2006  . TOBACCO ABUSE 04/19/2006  . RESTLESS LEG SYNDROME 04/19/2006  . CARPAL TUNNEL SYNDROME, BILATERAL 04/19/2006  . MENORRHAGIA 04/19/2006  . DEGENERATIVE DISC DISEASE 04/19/2006  . COUGH 04/19/2006  . LIVER FUNCTION TESTS, ABNORMAL 04/19/2006  . ELEVATED BLOOD PRESSURE WITHOUT DIAGNOSIS OF HYPERTENSION 04/19/2006    Past Surgical History:  Procedure Laterality Date  . FOOT SURGERY Left     OB History    No data available       Home Medications    Prior to Admission medications   Medication Sig Start Date End Date Taking? Authorizing Provider    albuterol (PROVENTIL HFA;VENTOLIN HFA) 108 (90 Base) MCG/ACT inhaler Inhale 1-2 puffs into the lungs every 6 (six) hours as needed for wheezing or shortness of breath. 05/05/16   Chase PicketJaime Pilcher Ward, PA-C  aspirin EC 81 MG tablet Take 81 mg by mouth daily.    Historical Provider, MD  ATORVASTATIN CALCIUM PO Take 1.5 tablets by mouth daily.    Historical Provider, MD  azithromycin (ZITHROMAX) 250 MG tablet Take 1 tablet (250 mg total) by mouth daily. Take first 2 tablets together, then 1 every day until finished. 05/05/16   Jaime Pilcher Ward, PA-C  benzonatate (TESSALON) 100 MG capsule Take 1 capsule (100 mg total) by mouth 3 (three) times daily as needed for cough. 05/05/16   Chase PicketJaime Pilcher Ward, PA-C  cyclobenzaprine (FLEXERIL) 10 MG tablet Take 10 mg by mouth 3 (three) times daily as needed for muscle spasms.    Historical Provider, MD  diazepam (VALIUM) 5 MG tablet Take 1 tablet (5 mg total) by mouth every 8 (eight) hours as needed for muscle spasms. Patient not taking: Reported on 08/30/2015 03/15/14   Kristen N Ward, DO  ibuprofen (ADVIL,MOTRIN) 800 MG tablet Take 1 tablet (800 mg total) by mouth every 8 (eight) hours as needed for mild pain. Patient taking differently: Take 800 mg by mouth every 6 (six) hours as needed for mild pain or moderate pain.  03/15/14   Kristen N Ward, DO  loperamide (IMODIUM) 2  MG capsule Take 1 capsule (2 mg total) by mouth 4 (four) times daily as needed for diarrhea or loose stools. 08/31/15   Lavera Guise, MD  metFORMIN (GLUCOPHAGE) 500 MG tablet Take 500 mg by mouth 2 (two) times daily with a meal.    Historical Provider, MD  naproxen (NAPROSYN) 500 MG tablet Take 1 tablet (500 mg total) by mouth 2 (two) times daily. Patient not taking: Reported on 08/30/2015 07/05/15   Joycie Peek, PA-C  omeprazole (PRILOSEC) 20 MG capsule Take 20 mg by mouth 2 (two) times daily.    Historical Provider, MD  ondansetron (ZOFRAN) 4 MG tablet Take 1 tablet (4 mg total) by mouth every  6 (six) hours. 08/31/15   Lavera Guise, MD  oxyCODONE-acetaminophen (PERCOCET/ROXICET) 5-325 MG per tablet Take 1 tablet by mouth every 4 (four) hours as needed. Patient not taking: Reported on 08/30/2015 03/15/14   Kristen N Ward, DO  pravastatin (PRAVACHOL) 80 MG tablet Take 80 mg by mouth daily.    Historical Provider, MD  SERTRALINE HCL PO Take 1 tablet by mouth daily.    Historical Provider, MD    Family History Family History  Problem Relation Age of Onset  . Crohn's disease Mother   . Hypertension Father     Social History Social History  Substance Use Topics  . Smoking status: Never Smoker  . Smokeless tobacco: Never Used  . Alcohol use Yes     Comment: weekends     Allergies   Patient has no known allergies.   Review of Systems Review of Systems  Constitutional: Negative for chills and fever.  HENT: Positive for congestion and rhinorrhea. Negative for sore throat and trouble swallowing.   Eyes: Negative for visual disturbance.  Respiratory: Positive for cough. Negative for shortness of breath.   Cardiovascular: Negative.   Gastrointestinal: Negative for abdominal pain, nausea and vomiting.  Genitourinary: Negative for dysuria.  Musculoskeletal: Negative for back pain and neck pain.  Skin: Negative for rash.  Neurological: Negative for headaches.     Physical Exam Updated Vital Signs BP 135/89 (BP Location: Left Arm)   Pulse 79   Temp 98.3 F (36.8 C) (Oral)   Resp 18   SpO2 94%   Physical Exam  Constitutional: She is oriented to person, place, and time. She appears well-developed and well-nourished. No distress.  HENT:  Head: Normocephalic and atraumatic.  OP with erythema, no exudates or tonsillar hypertrophy. + nasal congestion with mucosal edema. No focal areas of sinus tenderness.  Neck: Normal range of motion. Neck supple.  No meningeal signs.   Cardiovascular: Normal rate, regular rhythm and normal heart sounds.   Pulmonary/Chest: Effort normal.   Lungs are clear to auscultation bilaterally - no w/r/r  Abdominal: Soft. She exhibits no distension. There is no tenderness.  Musculoskeletal: Normal range of motion.  Neurological: She is alert and oriented to person, place, and time.  Skin: Skin is warm and dry. She is not diaphoretic.  Nursing note and vitals reviewed.    ED Treatments / Results  Labs (all labs ordered are listed, but only abnormal results are displayed) Labs Reviewed - No data to display  EKG  EKG Interpretation None       Radiology Dg Chest 2 View  Result Date: 05/05/2016 CLINICAL DATA:  Productive cough and weakness for 3 weeks. EXAM: CHEST  2 VIEW COMPARISON:  08/30/2015 FINDINGS: Low lung volumes are present, causing crowding of the pulmonary vasculature. Lower thoracic spondylosis. Mild enlargement  of the cardiopericardial silhouette, without edema. No pneumothorax. Interval absence the right distal clavicle, query Mumford procedure in the last 7 months. IMPRESSION: 1. Mild stable enlargement of the cardiopericardial silhouette, without edema 2. Interval absence of the right distal clavicle, query Mumford procedure in the last 7 months. 3. Thoracic spondylosis. Electronically Signed   By: Gaylyn RongWalter  Liebkemann M.D.   On: 05/05/2016 11:02    Procedures Procedures (including critical care time)  Medications Ordered in ED Medications - No data to display   Initial Impression / Assessment and Plan / ED Course  I have reviewed the triage vital signs and the nursing notes.  Pertinent labs & imaging results that were available during my care of the patient were reviewed by me and considered in my medical decision making (see chart for details).  Clinical Course    Shawna Horton is a 55 y.o. female who presents to ED for Worsening cough for the last 3 weeks. Initially was dry, but over the last several days has become productive in nature. She is a daily smoker. On exam, she has clear lung sounds and no  focal areas of tenderness tenderness. Chest x-ray with no PNA. Will treat with zithromax and tessalon. PCP follow-up strongly encouraged. Reasons for return to the ER were discussed and all questions were answered.  Final Clinical Impressions(s) / ED Diagnoses   Final diagnoses:  Productive cough  Bronchitis    New Prescriptions Discharge Medication List as of 05/05/2016 11:16 AM    START taking these medications   Details  azithromycin (ZITHROMAX) 250 MG tablet Take 1 tablet (250 mg total) by mouth daily. Take first 2 tablets together, then 1 every day until finished., Starting Sat 05/05/2016, Print    benzonatate (TESSALON) 100 MG capsule Take 1 capsule (100 mg total) by mouth 3 (three) times daily as needed for cough., Starting Sat 05/05/2016, Print         CIT GroupJaime Pilcher Ward, PA-C 05/05/16 1318    Lorre NickAnthony Allen, MD 05/06/16 1515

## 2017-07-05 ENCOUNTER — Emergency Department (HOSPITAL_COMMUNITY): Admission: EM | Admit: 2017-07-05 | Payer: Non-veteran care | Source: Home / Self Care

## 2017-07-05 ENCOUNTER — Other Ambulatory Visit: Payer: Self-pay

## 2017-08-12 ENCOUNTER — Other Ambulatory Visit: Payer: Self-pay

## 2017-08-12 ENCOUNTER — Encounter (HOSPITAL_COMMUNITY)
Admission: RE | Admit: 2017-08-12 | Discharge: 2017-08-12 | Disposition: A | Discharge status: Home / Self Care | Payer: Non-veteran care | Source: Ambulatory Visit | Attending: Orthopedic Surgery | Admitting: Orthopedic Surgery

## 2017-08-12 ENCOUNTER — Encounter (HOSPITAL_COMMUNITY): Payer: Self-pay

## 2017-08-12 DIAGNOSIS — Z01810 Encounter for preprocedural cardiovascular examination: Secondary | ICD-10-CM | POA: Diagnosis not present

## 2017-08-12 DIAGNOSIS — E119 Type 2 diabetes mellitus without complications: Secondary | ICD-10-CM | POA: Diagnosis present

## 2017-08-12 DIAGNOSIS — Z01812 Encounter for preprocedural laboratory examination: Secondary | ICD-10-CM | POA: Diagnosis present

## 2017-08-12 HISTORY — DX: Gastro-esophageal reflux disease without esophagitis: K21.9

## 2017-08-12 HISTORY — DX: Anxiety disorder, unspecified: F41.9

## 2017-08-12 HISTORY — DX: Unspecified osteoarthritis, unspecified site: M19.90

## 2017-08-12 HISTORY — DX: Depression, unspecified: F32.A

## 2017-08-12 HISTORY — DX: Post-traumatic stress disorder, unspecified: F43.10

## 2017-08-12 HISTORY — DX: Major depressive disorder, single episode, unspecified: F32.9

## 2017-08-12 HISTORY — DX: Unspecified asthma, uncomplicated: J45.909

## 2017-08-12 LAB — BASIC METABOLIC PANEL
Anion gap: 12 (ref 5–15)
BUN: 10 mg/dL (ref 6–20)
CO2: 21 mmol/L — ABNORMAL LOW (ref 22–32)
CREATININE: 0.79 mg/dL (ref 0.44–1.00)
Calcium: 9 mg/dL (ref 8.9–10.3)
Chloride: 107 mmol/L (ref 101–111)
GFR calc Af Amer: >60 mL/min (ref >60)
Glucose, Bld: 114 mg/dL — ABNORMAL HIGH (ref 65–99)
Potassium: 3.9 mmol/L (ref 3.5–5.1)
SODIUM: 140 mmol/L (ref 135–145)

## 2017-08-12 LAB — GLUCOSE, CAPILLARY: Glucose-Capillary: 110 mg/dL — ABNORMAL HIGH (ref 65–99)

## 2017-08-12 LAB — TYPE AND SCREEN
ABO/RH(D): O POS
Antibody Screen: NEGATIVE

## 2017-08-12 LAB — SURGICAL PCR SCREEN
MRSA, PCR: NEGATIVE
Staphylococcus aureus: NEGATIVE

## 2017-08-12 LAB — CBC
HCT: 40.5 % (ref 36.0–46.0)
Hemoglobin: 13.1 g/dL (ref 12.0–15.0)
MCH: 27.5 pg (ref 26.0–34.0)
MCHC: 32.3 g/dL (ref 30.0–36.0)
MCV: 85.1 fL (ref 78.0–100.0)
PLATELETS: 290 10*3/uL (ref 150–400)
RBC: 4.76 MIL/uL (ref 3.87–5.11)
RDW: 16.7 % — ABNORMAL HIGH (ref 11.5–15.5)
WBC: 5.2 10*3/uL (ref 4.0–10.5)

## 2017-08-12 LAB — ABO/RH: ABO/RH(D): O POS

## 2017-08-12 NOTE — Progress Notes (Signed)
[facility]

## 2017-08-12 NOTE — Pre-Procedure Instructions (Signed)
Shawna Horton  08/12/2017      [facility] - [place], [place] - [address] [name] [id number] Phone: [phone] Fax: [phone]    Your procedure is scheduled on August 21, 2017.  [facility] Admitting at 630 AM  Call this number if you have problems the morning of surgery:  [phone]   Remember:  Do not eat food or drink liquids after midnight.  Take these medicines the morning of surgery with A SIP OF WATER albuterol inhaler (bring inhaler with you), omeprazole (prilosec), eye drops-if needed, sertaline (zoloft), tizanidine (zanaflex)-if needed.  7 days prior to surgery STOP taking any Aspirin (unless otherwise instructed by your surgeon), Aleve, Naproxen, Ibuprofen, Motrin, Advil, Goody's, BC's, all herbal medications, fish oil, and all vitamins  Continue all other medications as instructed by your physician except follow the above medication instructions before surgery  WHAT DO I DO ABOUT MY DIABETES MEDICATION?  Marland Kitchen. Do not take oral diabetes medicines (pills) the morning of surgery (metformin/glucophage).  . The day of surgery, do not take other diabetes injectables, including Byetta (exenatide), Bydureon (exenatide ER), Victoza (liraglutide), or Trulicity (dulaglutide).  How to Manage Your Diabetes Before and After Surgery  Why is it important to control my blood sugar before and after surgery? . Improving blood sugar levels before and after surgery helps healing and can limit problems. . A way of improving blood sugar control is eating a healthy diet by: o  Eating less sugar and carbohydrates o  Increasing activity/exercise o  Talking with your doctor about reaching your blood sugar goals . High blood sugars (greater than 180 mg/dL) can raise your risk of infections and slow your recovery, so you will need to focus on controlling your diabetes during the weeks before surgery. . Make sure that  the doctor who takes care of your diabetes knows about your planned surgery including the date and location.  How do I manage my blood sugar before surgery? . Check your blood sugar at least 4 times a day, starting 2 days before surgery, to make sure that the level is not too high or low. o Check your blood sugar the morning of your surgery when you wake up and every 2 hours until you get to the Short Stay unit. . If your blood sugar is less than 70 mg/dL, you will need to treat for low blood sugar: o Do not take insulin. o Treat a low blood sugar (less than 70 mg/dL) with  cup of clear juice (cranberry or apple), 4 glucose tablets, OR glucose gel. Recheck blood sugar in 15 minutes after treatment (to make sure it is greater than 70 mg/dL). If your blood sugar is not greater than 70 mg/dL on recheck, call [phone] o  for further instructions. . Report your blood sugar to the short stay nurse when you get to Short Stay.  . If you are admitted to the hospital after surgery: o Your blood sugar will be checked by the staff and you will probably be given insulin after surgery (instead of oral diabetes medicines) to make sure you have good blood sugar levels. o The goal for blood sugar control after surgery is 80-180 mg/dL.  Reviewed and Endorsed by [facility] Patient Education Committee, August 2015   Do not wear jewelry, make-up or nail polish.  Do not wear lotions, powders, or perfumes, or deodorant.  Do not shave 48 hours prior to surgery.  Men may shave face and neck.  Do not bring valuables to the hospital.  [facility] is not responsible for any belongings or valuables.  Contacts, dentures or bridgework may not be worn into surgery.  Leave your suitcase in the car.  After surgery it may be brought to your room.  For patients admitted to the hospital, discharge time will be determined by your treatment team.  Patients discharged the day of surgery will not be allowed to drive home.    Special instructions:   - Preparing For Surgery  Before surgery, you can play an important role. Because skin is not sterile, your skin needs to be as free of germs as possible. You can reduce the number of germs on your skin by washing with CHG (chlorahexidine gluconate) Soap before surgery.  CHG is an antiseptic cleaner which kills germs and bonds with the skin to continue killing germs even after washing.  Please do not use if you have an allergy to CHG or antibacterial soaps. If your skin becomes reddened/irritated stop using the CHG.  Do not shave (including legs and underarms) for at least 48 hours prior to first CHG shower. It is OK to shave your face.  Please follow these instructions carefully.   1. Shower the NIGHT BEFORE SURGERY and the MORNING OF SURGERY with CHG.   2. If you chose to wash your hair, wash your hair first as usual with your normal shampoo.  3. After you shampoo, rinse your hair and body thoroughly to remove the shampoo.  4. Use CHG as you would any other liquid soap. You can apply CHG directly to the skin and wash gently with a scrungie or a clean washcloth.   5. Apply the CHG Soap to your body ONLY FROM THE NECK DOWN.  Do not use on open wounds or open sores. Avoid contact with your eyes, ears, mouth and genitals (private parts). Wash Face and genitals (private parts)  with your normal soap.  6. Wash thoroughly, paying special attention to the area where your surgery will be performed.  7. Thoroughly rinse your body with warm water from the neck down.  8. DO NOT shower/wash with your normal soap after using and rinsing off the CHG Soap.  9. Pat yourself dry with a CLEAN TOWEL.  10. Wear CLEAN PAJAMAS to bed the night before surgery, wear comfortable clothes the morning of surgery  11. Place CLEAN SHEETS on your bed the night of your first shower and DO NOT SLEEP WITH PETS.   Day of Surgery: Do not apply any deodorants/lotions. Please wear  clean clothes to the hospital/surgery center.     Please read over the following fact sheets that you were given. Pain Booklet, Coughing and Deep Breathing, MRSA Information and Surgical Site Infection Prevention

## 2017-08-13 NOTE — Progress Notes (Signed)
Anesthesia Chart Review:  Pt is a 57 year old female scheduled for L3-4, L4-5 transforaminal lumbar interbody fusion on 08/21/2017 with Venita Lickahari Brooks, MD  -[facility] Cleared for surgery by Nicholes Calamityheryl Sexton, MD  PMH includes:  DM, hyperlipidemia, asthma, PTSD, GERD. Current smoker. BMI 36.5  Medications include: Albuterol, Lipitor, metformin, Prilosec, prazosin  BP (!) 142/81   Pulse 87   Temp 36.9 C   Resp 20   Ht 5' 8.5" (1.74 m)   Wt 243 lb 4 oz (110.3 kg)   SpO2 98%   BMI 36.45 kg/m   Preoperative labs reviewed.   - [facility]  [facility]. Moderate voltage criteria for LVH, may be normal variant. Nonspecific T wave abnormality. Prolonged QT (QTc 469)  If no changes, I anticipate pt can proceed with surgery as scheduled.   Rica Mastngela Dnya Hickle, FNP-BC [facility]iology Phone: [phone] 08/13/2017 2:39 PM

## 2017-08-16 DIAGNOSIS — M5416 Radiculopathy, lumbar region: Secondary | ICD-10-CM | POA: Insufficient documentation

## 2017-08-16 NOTE — H&P (Addendum)
Patient ID: Shawna Horton MRN: [id number] DOB/AGE: [birth year] 59 y.o.  Admit date: (Not on file)  Admission Diagnoses:  Lumbar degenerative disc disease and radiculopathy  HPI: Very pleasant 57 year old female patient presents to the clinic preop.  Patient is having a trans-foraminal lumbar interbody fusion next week.  Patient is diabetic but she has worked hard to get her A1c down to 7.7 with the help of her provider.  She has been cleared by her providers for surgical intervention  Past Medical History: Past Medical History:  Diagnosis Date  . Anxiety   . Arthritis   . Asthma   . Back pain, chronic   . Bursitis   . Depression   . Diabetes mellitus without complication (HCC)   . GERD (gastroesophageal reflux disease)   . Hyperlipemia   . PTSD (post-traumatic stress disorder)     Surgical History: Past Surgical History:  Procedure Laterality Date  . BILATERAL CARPAL TUNNEL RELEASE     mult  hand surgeries bilateral  . FOOT SURGERY Left   . SHOULDER ARTHROSCOPY     left    Family History: Family History  Problem Relation Age of Onset  . Crohn's disease Mother   . Hypertension Father     Social History: Social History   Socioeconomic History  . Marital status: Single    Spouse name: Not on file  . Number of children: Not on file  . Years of education: Not on file  . Highest education level: Not on file  Social Needs  . Financial resource strain: Not on file  . Food insecurity - worry: Not on file  . Food insecurity - inability: Not on file  . Transportation needs - medical: Not on file  . Transportation needs - non-medical: Not on file  Occupational History  . Not on file  Tobacco Use  . Smoking status: Current Every Day Smoker  . Smokeless tobacco: Never Used  Substance and Sexual Activity  . Alcohol use: Yes    Comment: weekends  . Drug use: No  . Sexual activity: Not on file  Other Topics Concern  . Not on file  Social History Narrative  .  Not on file    Allergies: Patient has no known allergies.  Medications: I have reviewed the patient's current medications.  Vital Signs: No data found.  Radiology: No results found.  Labs: No results for input(s): WBC, RBC, HCT, PLT in the last 72 hours. No results for input(s): NA, K, CL, CO2, BUN, CREATININE, GLUCOSE, CALCIUM in the last 72 hours. No results for input(s): LABPT, INR in the last 72 hours.  Review of Systems: ROS  Physical Exam: There is no height or weight on file to calculate BMI.  Physical Exam  Constitutional: She is oriented to person, place, and time. She appears well-developed and well-nourished.  HENT:  Head: Normocephalic.  Eyes: Pupils are equal, round, and reactive to light.  Cardiovascular: Normal rate, regular rhythm and normal heart sounds.  Respiratory: Effort normal and breath sounds normal.  GI: Soft. Bowel sounds are normal.  Neurological: She is alert and oriented to person, place, and time. She has normal reflexes.  Skin: Skin is warm and dry.  Psychiatric: She has a normal mood and affect. Her behavior is normal. Judgment and thought content normal.   to palpation of bilateral lumbar paraspinals range of motion flexion extension lateral bending does increase pain radicular pain and weakness noted bilateral lower extremities patient does utilize a cane  for ambulation.  Musculoskeletal: Patient has significant lumbar spine pain with palpation and range of motion.  5 out of 5 motor strength in the lower extremity.  Positive dysesthesias and numbness in the left L4 and L5 dermatome.  No significant hip, knee, ankle pain with isolated joint range of motion.  Reflexes are 1+ and symmetrical in the lower extremity.  Negative Babinski test, no clonus.  Abdomen is soft and nontender.  No incontinence of bowel or bladder.  Assessment and Plan: Risks and benefits of surgery were discussed with the patient. These include: Infection, bleeding,  death, stroke, paralysis, ongoing or worse pain, need for additional surgery, nonunion, leak of spinal fluid, adjacent segment degeneration requiring additional fusion surgery, Injury to abdominal vessels that can require anterior surgery to stop bleeding. Malposition of the cage and/or pedicle screws that could require additional surgery. Loss of bowel and bladder control. Postoperative hematoma causing neurologic compression that could require urgent or emergent re-operation.  Patient's MRI done in November 2018 was significant for said severe disc height loss prominent bulging moderate large left central foraminal disc extrusion thickening of the ligamentum flavum severe left moderate right facet arthrosis left L3 nerve roots and mild dorsal dorsal displacement of exiting right L3 nerve roots L4-5 severe disc height loss prominent disc bulging moderate right foraminal extraforaminal disc extrusion moderate bilateral neuroforaminal narrowing contacting mild cranial displacement of exiting L4 nerve roots   Anette Riedel, PAC for Venita Lick, MD [facility] [phone]  Reviewed the procedure again with the patient.  Also discussed the risks and benefits of surgery.  Plan on moving forward with 2 level transforaminal lumbar interbody fusion L3-4 and L4-5 to address the severe back buttock and neuropathic left leg pain.

## 2017-08-21 ENCOUNTER — Inpatient Hospital Stay (HOSPITAL_COMMUNITY): Payer: Non-veteran care | Admitting: Emergency Medicine

## 2017-08-21 ENCOUNTER — Inpatient Hospital Stay (HOSPITAL_COMMUNITY): Payer: Non-veteran care

## 2017-08-21 ENCOUNTER — Inpatient Hospital Stay (HOSPITAL_COMMUNITY): Payer: Non-veteran care | Admitting: Anesthesiology

## 2017-08-21 ENCOUNTER — Inpatient Hospital Stay (HOSPITAL_COMMUNITY)
Admission: RE | Disposition: A | Discharge status: Home Health | Payer: Self-pay | Source: Ambulatory Visit | Attending: Orthopedic Surgery

## 2017-08-21 ENCOUNTER — Encounter (HOSPITAL_COMMUNITY): Payer: Self-pay | Admitting: Urology

## 2017-08-21 ENCOUNTER — Inpatient Hospital Stay (HOSPITAL_COMMUNITY)
Admission: RE | Admit: 2017-08-21 | Discharge: 2017-08-23 | DRG: 460 | Disposition: A | Discharge status: Home Health | Payer: Non-veteran care | Source: Ambulatory Visit | Attending: Orthopedic Surgery | Admitting: Orthopedic Surgery

## 2017-08-21 DIAGNOSIS — E119 Type 2 diabetes mellitus without complications: Secondary | ICD-10-CM | POA: Diagnosis present

## 2017-08-21 DIAGNOSIS — J45909 Unspecified asthma, uncomplicated: Secondary | ICD-10-CM | POA: Diagnosis present

## 2017-08-21 DIAGNOSIS — F419 Anxiety disorder, unspecified: Secondary | ICD-10-CM | POA: Diagnosis present

## 2017-08-21 DIAGNOSIS — E785 Hyperlipidemia, unspecified: Secondary | ICD-10-CM | POA: Diagnosis present

## 2017-08-21 DIAGNOSIS — F17200 Nicotine dependence, unspecified, uncomplicated: Secondary | ICD-10-CM | POA: Diagnosis present

## 2017-08-21 DIAGNOSIS — Z79899 Other long term (current) drug therapy: Secondary | ICD-10-CM

## 2017-08-21 DIAGNOSIS — M545 Low back pain: Secondary | ICD-10-CM | POA: Diagnosis present

## 2017-08-21 DIAGNOSIS — M5116 Intervertebral disc disorders with radiculopathy, lumbar region: Secondary | ICD-10-CM | POA: Diagnosis present

## 2017-08-21 DIAGNOSIS — Z7984 Long term (current) use of oral hypoglycemic drugs: Secondary | ICD-10-CM | POA: Diagnosis not present

## 2017-08-21 DIAGNOSIS — F329 Major depressive disorder, single episode, unspecified: Secondary | ICD-10-CM | POA: Diagnosis present

## 2017-08-21 DIAGNOSIS — K219 Gastro-esophageal reflux disease without esophagitis: Secondary | ICD-10-CM | POA: Diagnosis present

## 2017-08-21 DIAGNOSIS — Z981 Arthrodesis status: Secondary | ICD-10-CM

## 2017-08-21 DIAGNOSIS — F4310 Post-traumatic stress disorder, unspecified: Secondary | ICD-10-CM | POA: Diagnosis present

## 2017-08-21 DIAGNOSIS — Z419 Encounter for procedure for purposes other than remedying health state, unspecified: Secondary | ICD-10-CM

## 2017-08-21 HISTORY — PX: TRANSFORAMINAL LUMBAR INTERBODY FUSION (TLIF) WITH PEDICLE SCREW FIXATION 2 LEVEL: SHX6142

## 2017-08-21 LAB — GLUCOSE, CAPILLARY
GLUCOSE-CAPILLARY: 111 mg/dL — AB (ref 65–99)
GLUCOSE-CAPILLARY: 152 mg/dL — AB (ref 65–99)
GLUCOSE-CAPILLARY: 161 mg/dL — AB (ref 65–99)
GLUCOSE-CAPILLARY: 176 mg/dL — AB (ref 65–99)
Glucose-Capillary: 172 mg/dL — ABNORMAL HIGH (ref 65–99)

## 2017-08-21 SURGERY — TRANSFORAMINAL LUMBAR INTERBODY FUSION (TLIF) WITH PEDICLE SCREW FIXATION 2 LEVEL
Anesthesia: General | Site: Spine Lumbar

## 2017-08-21 MED ORDER — PROPOFOL 500 MG/50ML IV EMUL
INTRAVENOUS | Status: DC | PRN
  Administered 2017-08-21: 50 ug/kg/min via INTRAVENOUS
  Administered 2017-08-21: 11:00:00 via INTRAVENOUS

## 2017-08-21 MED ORDER — BUPIVACAINE-EPINEPHRINE 0.25% -1:200000 IJ SOLN
INTRAMUSCULAR | Status: DC | PRN
  Administered 2017-08-21: 10 mL

## 2017-08-21 MED ORDER — PROPOFOL 10 MG/ML IV BOLUS
INTRAVENOUS | Status: AC
  Filled 2017-08-21: qty 20

## 2017-08-21 MED ORDER — ONDANSETRON 4 MG PO TBDP: 4.0000 mg | ORAL_TABLET | Freq: Three times a day (TID) | ORAL | 0 refills | Status: DC | PRN

## 2017-08-21 MED ORDER — DEXAMETHASONE SODIUM PHOSPHATE 10 MG/ML IJ SOLN
INTRAMUSCULAR | Status: AC
  Filled 2017-08-21: qty 1

## 2017-08-21 MED ORDER — ONDANSETRON HCL 4 MG PO TABS
4.0000 mg | ORAL_TABLET | Freq: Four times a day (QID) | ORAL | Status: DC | PRN
Start: 2017-08-21 — End: 2017-08-23

## 2017-08-21 MED ORDER — MIDAZOLAM HCL 2 MG/2ML IJ SOLN
INTRAMUSCULAR | Status: AC
  Filled 2017-08-21: qty 2

## 2017-08-21 MED ORDER — ACETAMINOPHEN 10 MG/ML IV SOLN
1000.0000 mg | INTRAVENOUS | Status: AC
  Administered 2017-08-21: 1000 mg via INTRAVENOUS
  Filled 2017-08-21: qty 100

## 2017-08-21 MED ORDER — POLYVINYL ALCOHOL 1.4 % OP SOLN
1.0000 [drp] | Freq: Two times a day (BID) | OPHTHALMIC | Status: DC
  Administered 2017-08-21 – 2017-08-22 (×2): 1 [drp] via OPHTHALMIC
  Filled 2017-08-21: qty 15

## 2017-08-21 MED ORDER — SODIUM CHLORIDE 0.9% FLUSH
3.0000 mL | Freq: Two times a day (BID) | INTRAVENOUS | Status: DC
  Administered 2017-08-21: 3 mL via INTRAVENOUS

## 2017-08-21 MED ORDER — DEXAMETHASONE SODIUM PHOSPHATE 10 MG/ML IJ SOLN
INTRAMUSCULAR | Status: DC | PRN
  Administered 2017-08-21: 10 mg via INTRAVENOUS

## 2017-08-21 MED ORDER — LATANOPROST 0.005 % OP SOLN
1.0000 [drp] | Freq: Every day | OPHTHALMIC | Status: DC
  Administered 2017-08-21 – 2017-08-22 (×2): 1 [drp] via OPHTHALMIC
  Filled 2017-08-21: qty 2.5

## 2017-08-21 MED ORDER — SERTRALINE HCL 50 MG PO TABS
100.0000 mg | ORAL_TABLET | Freq: Two times a day (BID) | ORAL | Status: DC
  Filled 2017-08-21 (×3): qty 2

## 2017-08-21 MED ORDER — ALBUTEROL SULFATE (2.5 MG/3ML) 0.083% IN NEBU: 3.0000 mL | INHALATION_SOLUTION | Freq: Four times a day (QID) | RESPIRATORY_TRACT | Status: DC | PRN

## 2017-08-21 MED ORDER — LIDOCAINE HCL (CARDIAC) 20 MG/ML IV SOLN
INTRAVENOUS | Status: AC
  Filled 2017-08-21: qty 5

## 2017-08-21 MED ORDER — DOCUSATE SODIUM 100 MG PO CAPS
100.0000 mg | ORAL_CAPSULE | Freq: Two times a day (BID) | ORAL | Status: DC
  Administered 2017-08-21 – 2017-08-23 (×2): 100 mg via ORAL
  Filled 2017-08-21 (×4): qty 1

## 2017-08-21 MED ORDER — CEFAZOLIN SODIUM-DEXTROSE 2-4 GM/100ML-% IV SOLN
2.0000 g | INTRAVENOUS | Status: AC
  Administered 2017-08-21 (×2): 2 g via INTRAVENOUS

## 2017-08-21 MED ORDER — HEMOSTATIC AGENTS (NO CHARGE) OPTIME
TOPICAL | Status: DC | PRN
  Administered 2017-08-21 (×2): 1

## 2017-08-21 MED ORDER — PHENYLEPHRINE HCL 10 MG/ML IJ SOLN
INTRAMUSCULAR | Status: DC | PRN
Start: 2017-08-21 — End: 2017-08-21
  Administered 2017-08-21: 80 ug via INTRAVENOUS

## 2017-08-21 MED ORDER — KETAMINE HCL 10 MG/ML IJ SOLN
INTRAMUSCULAR | Status: AC
  Filled 2017-08-21: qty 1

## 2017-08-21 MED ORDER — FENTANYL CITRATE (PF) 250 MCG/5ML IJ SOLN
INTRAMUSCULAR | Status: AC
  Filled 2017-08-21: qty 5

## 2017-08-21 MED ORDER — SUCCINYLCHOLINE CHLORIDE 20 MG/ML IJ SOLN
INTRAMUSCULAR | Status: DC | PRN
  Administered 2017-08-21: 140 mg via INTRAVENOUS
  Administered 2017-08-21: 20 mg via INTRAVENOUS

## 2017-08-21 MED ORDER — ACETAMINOPHEN 650 MG RE SUPP
650.0000 mg | RECTAL | Status: DC | PRN
Start: 2017-08-21 — End: 2017-08-23

## 2017-08-21 MED ORDER — ACETAMINOPHEN 325 MG PO TABS
650.0000 mg | ORAL_TABLET | ORAL | Status: DC | PRN
Start: 2017-08-21 — End: 2017-08-23
  Administered 2017-08-21 – 2017-08-23 (×5): 650 mg via ORAL
  Filled 2017-08-21 (×5): qty 2

## 2017-08-21 MED ORDER — OXYCODONE HCL 5 MG PO TABS: 5.0000 mg | ORAL_TABLET | ORAL | Status: DC | PRN

## 2017-08-21 MED ORDER — CEFAZOLIN SODIUM 1 G IJ SOLR
INTRAMUSCULAR | Status: AC
  Filled 2017-08-21: qty 20

## 2017-08-21 MED ORDER — KETAMINE HCL 100 MG/ML IJ SOLN
INTRAMUSCULAR | Status: AC
  Filled 2017-08-21: qty 1

## 2017-08-21 MED ORDER — PHENYLEPHRINE HCL 10 MG/ML IJ SOLN
INTRAVENOUS | Status: DC | PRN
  Administered 2017-08-21: 25 ug/min via INTRAVENOUS

## 2017-08-21 MED ORDER — THROMBIN 20000 UNITS EX SOLR
CUTANEOUS | Status: DC | PRN
  Administered 2017-08-21: 20000 [IU] via TOPICAL

## 2017-08-21 MED ORDER — FENTANYL CITRATE (PF) 100 MCG/2ML IJ SOLN
INTRAMUSCULAR | Status: DC | PRN
  Administered 2017-08-21 (×3): 50 ug via INTRAVENOUS
  Administered 2017-08-21 (×2): 100 ug via INTRAVENOUS
  Administered 2017-08-21: 50 ug via INTRAVENOUS

## 2017-08-21 MED ORDER — PANTOPRAZOLE SODIUM 40 MG PO TBEC
40.0000 mg | DELAYED_RELEASE_TABLET | Freq: Every day | ORAL | Status: DC
  Administered 2017-08-21 – 2017-08-23 (×3): 40 mg via ORAL
  Filled 2017-08-21 (×3): qty 1

## 2017-08-21 MED ORDER — ONDANSETRON HCL 4 MG/2ML IJ SOLN
INTRAMUSCULAR | Status: DC | PRN
  Administered 2017-08-21: 4 mg via INTRAVENOUS

## 2017-08-21 MED ORDER — SUCCINYLCHOLINE CHLORIDE 200 MG/10ML IV SOSY
PREFILLED_SYRINGE | INTRAVENOUS | Status: AC
  Filled 2017-08-21: qty 10

## 2017-08-21 MED ORDER — OXYCODONE HCL 5 MG PO TABS
10.0000 mg | ORAL_TABLET | ORAL | Status: DC | PRN
  Administered 2017-08-21 – 2017-08-23 (×16): 10 mg via ORAL
  Filled 2017-08-21 (×15): qty 2

## 2017-08-21 MED ORDER — KETAMINE HCL 10 MG/ML IJ SOLN
INTRAMUSCULAR | Status: DC | PRN
  Administered 2017-08-21 (×2): 30 mg via INTRAVENOUS

## 2017-08-21 MED ORDER — METHOCARBAMOL 500 MG PO TABS: 500.0000 mg | ORAL_TABLET | Freq: Three times a day (TID) | ORAL | 0 refills | Status: AC

## 2017-08-21 MED ORDER — OXYCODONE HCL 5 MG PO TABS
ORAL_TABLET | ORAL | Status: AC
  Filled 2017-08-21: qty 2

## 2017-08-21 MED ORDER — PRAZOSIN HCL 1 MG PO CAPS
1.0000 mg | ORAL_CAPSULE | Freq: Every day | ORAL | Status: DC
  Filled 2017-08-21 (×2): qty 1

## 2017-08-21 MED ORDER — PROMETHAZINE HCL 25 MG/ML IJ SOLN
INTRAMUSCULAR | Status: AC
  Filled 2017-08-21: qty 1

## 2017-08-21 MED ORDER — HYDROMORPHONE HCL 1 MG/ML IJ SOLN
0.2500 mg | INTRAMUSCULAR | Status: DC | PRN
  Administered 2017-08-21 (×4): 0.5 mg via INTRAVENOUS

## 2017-08-21 MED ORDER — HYDROMORPHONE HCL 1 MG/ML IJ SOLN
INTRAMUSCULAR | Status: AC
  Filled 2017-08-21: qty 1

## 2017-08-21 MED ORDER — CEFAZOLIN SODIUM-DEXTROSE 2-4 GM/100ML-% IV SOLN
INTRAVENOUS | Status: AC
Start: 2017-08-21 — End: 2017-08-21
  Filled 2017-08-21: qty 100

## 2017-08-21 MED ORDER — PHENYLEPHRINE 40 MCG/ML (10ML) SYRINGE FOR IV PUSH (FOR BLOOD PRESSURE SUPPORT)
PREFILLED_SYRINGE | INTRAVENOUS | Status: AC
  Filled 2017-08-21: qty 10

## 2017-08-21 MED ORDER — PROPOFOL 10 MG/ML IV BOLUS
INTRAVENOUS | Status: DC | PRN
  Administered 2017-08-21 (×3): 20 mg via INTRAVENOUS
  Administered 2017-08-21 (×2): 30 mg via INTRAVENOUS
  Administered 2017-08-21: 50 mg via INTRAVENOUS
  Administered 2017-08-21: 30 mg via INTRAVENOUS
  Administered 2017-08-21: 20 mg via INTRAVENOUS
  Administered 2017-08-21: 200 mg via INTRAVENOUS

## 2017-08-21 MED ORDER — ARTIFICIAL TEARS OPHTHALMIC OINT
TOPICAL_OINTMENT | OPHTHALMIC | Status: DC | PRN
  Administered 2017-08-21: 1 via OPHTHALMIC

## 2017-08-21 MED ORDER — PROPOFOL 1000 MG/100ML IV EMUL
INTRAVENOUS | Status: AC
  Filled 2017-08-21: qty 100

## 2017-08-21 MED ORDER — MIDAZOLAM HCL 5 MG/5ML IJ SOLN
INTRAMUSCULAR | Status: DC | PRN
  Administered 2017-08-21: 2 mg via INTRAVENOUS

## 2017-08-21 MED ORDER — METHOCARBAMOL 500 MG PO TABS
500.0000 mg | ORAL_TABLET | Freq: Four times a day (QID) | ORAL | Status: DC | PRN
  Administered 2017-08-21 – 2017-08-23 (×8): 500 mg via ORAL
  Filled 2017-08-21 (×7): qty 1

## 2017-08-21 MED ORDER — METFORMIN HCL 500 MG PO TABS
1000.0000 mg | ORAL_TABLET | Freq: Two times a day (BID) | ORAL | Status: DC
  Administered 2017-08-21 – 2017-08-23 (×4): 1000 mg via ORAL
  Filled 2017-08-21 (×4): qty 2

## 2017-08-21 MED ORDER — THROMBIN (RECOMBINANT) 20000 UNITS EX SOLR
CUTANEOUS | Status: AC
  Filled 2017-08-21: qty 20000

## 2017-08-21 MED ORDER — ATORVASTATIN CALCIUM 20 MG PO TABS
40.0000 mg | ORAL_TABLET | Freq: Every day | ORAL | Status: DC
  Administered 2017-08-21 – 2017-08-22 (×2): 40 mg via ORAL
  Filled 2017-08-21 (×2): qty 2

## 2017-08-21 MED ORDER — LACTATED RINGERS IV SOLN
INTRAVENOUS | Status: DC
  Administered 2017-08-21: 15:00:00 via INTRAVENOUS

## 2017-08-21 MED ORDER — CEFAZOLIN SODIUM-DEXTROSE 2-4 GM/100ML-% IV SOLN
2.0000 g | Freq: Three times a day (TID) | INTRAVENOUS | Status: AC
  Administered 2017-08-21 – 2017-08-22 (×2): 2 g via INTRAVENOUS
  Filled 2017-08-21 (×2): qty 100

## 2017-08-21 MED ORDER — MORPHINE SULFATE (PF) 4 MG/ML IV SOLN: 1.0000 mg | INTRAVENOUS | Status: DC | PRN

## 2017-08-21 MED ORDER — OXYCODONE-ACETAMINOPHEN 10-325 MG PO TABS: 1.0000 | ORAL_TABLET | ORAL | 0 refills | Status: DC | PRN

## 2017-08-21 MED ORDER — ONDANSETRON HCL 4 MG/2ML IJ SOLN: 4.0000 mg | Freq: Four times a day (QID) | INTRAMUSCULAR | Status: DC | PRN

## 2017-08-21 MED ORDER — PHENOL 1.4 % MT LIQD: 1.0000 | OROMUCOSAL | Status: DC | PRN

## 2017-08-21 MED ORDER — LACTATED RINGERS IV SOLN
INTRAVENOUS | Status: DC | PRN
  Administered 2017-08-21 (×3): via INTRAVENOUS

## 2017-08-21 MED ORDER — SODIUM CHLORIDE 0.9% FLUSH: 3.0000 mL | INTRAVENOUS | Status: DC | PRN

## 2017-08-21 MED ORDER — 0.9 % SODIUM CHLORIDE (POUR BTL) OPTIME
TOPICAL | Status: DC | PRN
  Administered 2017-08-21 (×2): 1000 mL

## 2017-08-21 MED ORDER — ONDANSETRON HCL 4 MG/2ML IJ SOLN
INTRAMUSCULAR | Status: AC
  Filled 2017-08-21: qty 2

## 2017-08-21 MED ORDER — LACTATED RINGERS IV SOLN
INTRAVENOUS | Status: DC | PRN
  Administered 2017-08-21: 09:00:00 via INTRAVENOUS

## 2017-08-21 MED ORDER — METHOCARBAMOL 500 MG PO TABS
ORAL_TABLET | ORAL | Status: AC
  Filled 2017-08-21: qty 1

## 2017-08-21 MED ORDER — METHOCARBAMOL 1000 MG/10ML IJ SOLN
500.0000 mg | Freq: Four times a day (QID) | INTRAVENOUS | Status: DC | PRN
  Filled 2017-08-21: qty 5

## 2017-08-21 MED ORDER — LIDOCAINE HCL (CARDIAC) 20 MG/ML IV SOLN
INTRAVENOUS | Status: DC | PRN
  Administered 2017-08-21: 60 mg via INTRAVENOUS
  Administered 2017-08-21: 40 mg via INTRAVENOUS

## 2017-08-21 MED ORDER — MENTHOL 3 MG MT LOZG: 1.0000 | LOZENGE | OROMUCOSAL | Status: DC | PRN

## 2017-08-21 MED ORDER — MAGNESIUM CITRATE PO SOLN
1.0000 | Freq: Once | ORAL | Status: AC | PRN
  Administered 2017-08-21: 1 via ORAL
  Filled 2017-08-21: qty 296

## 2017-08-21 MED ORDER — POLYETHYLENE GLYCOL 3350 17 G PO PACK: 17.0000 g | PACK | Freq: Every day | ORAL | Status: DC | PRN

## 2017-08-21 MED ORDER — PROMETHAZINE HCL 25 MG/ML IJ SOLN
6.2500 mg | INTRAMUSCULAR | Status: DC | PRN
  Administered 2017-08-21: 6.25 mg via INTRAVENOUS

## 2017-08-21 SURGICAL SUPPLY — 88 items
BUR EGG ELITE 4.0 (BURR) IMPLANT
BUR EGG ELITE 4.0MM (BURR)
CABLE BIPOLOR RESECTION CORD (MISCELLANEOUS) ×3 IMPLANT
CAGE TLIF NANOLOCK XLG 7 35X11 (Cage) ×3 IMPLANT
CAGE TLIF XL 8 LUMBAR (Cage) ×1 IMPLANT
CAGE TLIF XL 8MM LUMBAR (Cage) ×1 IMPLANT
CAGE TLIP XL 7MM (Cage) ×2 IMPLANT
CLIP NEUROVISION LG (CLIP) ×2 IMPLANT
CLOSURE STERI-STRIP 1/2X4 (GAUZE/BANDAGES/DRESSINGS) ×2
CLSR STERI-STRIP ANTIMIC 1/2X4 (GAUZE/BANDAGES/DRESSINGS) ×3 IMPLANT
COVER MAYO STAND STRL (DRAPES) ×2 IMPLANT
COVER SURGICAL LIGHT HANDLE (MISCELLANEOUS) ×3 IMPLANT
DEVICE ENDSKLTN NANOLCK 8MM XL (Cage) IMPLANT
DRAPE C-ARM 42X72 X-RAY (DRAPES) ×3 IMPLANT
DRAPE C-ARMOR (DRAPES) ×3 IMPLANT
DRAPE SURG 17X23 STRL (DRAPES) ×3 IMPLANT
DRAPE U-SHAPE 47X51 STRL (DRAPES) ×3 IMPLANT
DRSG OPSITE POSTOP 4X6 (GAUZE/BANDAGES/DRESSINGS) ×4 IMPLANT
DRSG OPSITE POSTOP 4X8 (GAUZE/BANDAGES/DRESSINGS) ×3 IMPLANT
DURAPREP 26ML APPLICATOR (WOUND CARE) ×3 IMPLANT
ELECT BLADE 4.0 EZ CLEAN MEGAD (MISCELLANEOUS)
ELECT BLADE 6.5 EXT (BLADE) ×3 IMPLANT
ELECT CAUTERY BLADE 6.4 (BLADE) ×3 IMPLANT
ELECT PENCIL ROCKER SW 15FT (MISCELLANEOUS) ×3 IMPLANT
ELECT REM PT RETURN 9FT ADLT (ELECTROSURGICAL) ×3
ELECTRODE BLDE 4.0 EZ CLN MEGD (MISCELLANEOUS) IMPLANT
ELECTRODE REM PT RTRN 9FT ADLT (ELECTROSURGICAL) ×1 IMPLANT
ENDOSKELETON NANOLOCK 8MM XL (Cage) ×3 IMPLANT
FLOSEAL 10ML (HEMOSTASIS) ×2 IMPLANT
GLOVE BIOGEL PI IND STRL 8.5 (GLOVE) ×1 IMPLANT
GLOVE BIOGEL PI INDICATOR 8.5 (GLOVE) ×2
GLOVE ECLIPSE 8.0 STRL XLNG CF (GLOVE) ×4 IMPLANT
GLOVE SS BIOGEL STRL SZ 8.5 (GLOVE) ×1 IMPLANT
GLOVE SUPERSENSE BIOGEL SZ 8.5 (GLOVE) ×2
GLOVE SURG SS PI 7.0 STRL IVOR (GLOVE) ×4 IMPLANT
GOWN STRL REUS W/ TWL LRG LVL3 (GOWN DISPOSABLE) ×1 IMPLANT
GOWN STRL REUS W/TWL 2XL LVL3 (GOWN DISPOSABLE) ×4 IMPLANT
GOWN STRL REUS W/TWL LRG LVL3 (GOWN DISPOSABLE) ×3
GUIDEWIRE NITINOL BEVEL TIP (WIRE) ×12 IMPLANT
KIT BASIN OR (CUSTOM PROCEDURE TRAY) ×3 IMPLANT
KIT POSITION SURG JACKSON T1 (MISCELLANEOUS) ×3 IMPLANT
KIT ROOM TURNOVER OR (KITS) ×3 IMPLANT
MODULE EMG NDL SSEP NVM5 (NEEDLE) IMPLANT
MODULE EMG NEEDLE SSEP NVM5 (NEEDLE) ×3 IMPLANT
MODULE NVM5 NEXT GEN EMG (NEEDLE) ×2 IMPLANT
NDL I-PASS III (NEEDLE) IMPLANT
NDL SPNL 18GX3.5 QUINCKE PK (NEEDLE) ×1 IMPLANT
NEEDLE 22X1 1/2 (OR ONLY) (NEEDLE) ×3 IMPLANT
NEEDLE I-PASS III (NEEDLE) ×3 IMPLANT
NEEDLE SPNL 18GX3.5 QUINCKE PK (NEEDLE) ×3 IMPLANT
NS IRRIG 1000ML POUR BTL (IV SOLUTION) ×3 IMPLANT
PACK LAMINECTOMY ORTHO (CUSTOM PROCEDURE TRAY) ×3 IMPLANT
PACK UNIVERSAL I (CUSTOM PROCEDURE TRAY) ×3 IMPLANT
PAD ARMBOARD 7.5X6 YLW CONV (MISCELLANEOUS) ×8 IMPLANT
PATTIES SURGICAL .5 X.5 (GAUZE/BANDAGES/DRESSINGS) IMPLANT
PATTIES SURGICAL .5 X1 (DISPOSABLE) ×3 IMPLANT
POSITIONER HEAD PRONE TRACH (MISCELLANEOUS) ×3 IMPLANT
PROBE BALL TIP NVM5 SNG USE (BALLOONS) ×2 IMPLANT
PUTTY DBX 1CC (Putty) ×3 IMPLANT
PUTTY DBX 1CC DEPUY (Putty) IMPLANT
REDUCTION EXT RELINE MAS MOD (Neuro Prosthesis/Implant) ×8 IMPLANT
ROD RELINE MAS LORDOTIC 5.5X60 (Rod) ×2 IMPLANT
ROD RELINE TI MAS LD 5.5X65 (Rod) ×2 IMPLANT
SCREW LOCK RELINE 5.5 TULIP (Screw) ×12 IMPLANT
SCREW RELINE MAS POLY 6.5X40MM (Screw) ×4 IMPLANT
SCREW SHANK MAS MOD 6.5X40MM (Screw) ×6 IMPLANT
SCREW SHANK RELINE MOD 7.5X45 (Screw) ×3 IMPLANT
SCREW SHANK RLINE MD 7.5X45 2C (Screw) IMPLANT
SHEET CONFORM 45LX20WX5H (Bone Implant) ×2 IMPLANT
SPONGE LAP 4X18 X RAY DECT (DISPOSABLE) ×4 IMPLANT
SPONGE SURGIFOAM ABS GEL 100 (HEMOSTASIS) ×3 IMPLANT
SPONGE SURGIFOAM ABS GEL SZ50 (HEMOSTASIS) ×3 IMPLANT
SURGIFLO W/THROMBIN 8M KIT (HEMOSTASIS) IMPLANT
SUT BONE WAX W31G (SUTURE) ×3 IMPLANT
SUT MNCRL AB 3-0 PS2 18 (SUTURE) ×6 IMPLANT
SUT VIC AB 1 CT1 18XCR BRD 8 (SUTURE) ×1 IMPLANT
SUT VIC AB 1 CT1 27 (SUTURE) ×3
SUT VIC AB 1 CT1 27XBRD ANBCTR (SUTURE) IMPLANT
SUT VIC AB 1 CT1 8-18 (SUTURE) ×6
SUT VIC AB 1 CTX 36 (SUTURE)
SUT VIC AB 1 CTX36XBRD ANBCTR (SUTURE) IMPLANT
SUT VIC AB 2-0 CT1 18 (SUTURE) ×3 IMPLANT
SYR BULB IRRIGATION 50ML (SYRINGE) ×3 IMPLANT
SYR CONTROL 10ML LL (SYRINGE) ×3 IMPLANT
TOWEL GREEN STERILE (TOWEL DISPOSABLE) ×3 IMPLANT
TOWEL GREEN STERILE FF (TOWEL DISPOSABLE) ×3 IMPLANT
TRAY FOLEY W/METER SILVER 16FR (SET/KITS/TRAYS/PACK) ×3 IMPLANT
YANKAUER SUCT BULB TIP NO VENT (SUCTIONS) ×3 IMPLANT

## 2017-08-21 NOTE — Transfer of Care (Signed)
Immediate Anesthesia Transfer of Care Note  Patient: Shawna Horton  Procedure(s) Performed: TRANSFORAMINAL LUMBAR INTERBODY FUSION (TLIF)  L3-4, L4-5 (N/A Spine Lumbar)  Patient Location: PACU  Anesthesia Type:General  Level of Consciousness: awake, oriented and patient cooperative  Airway & Oxygen Therapy: Patient Spontanous Breathing and Patient connected to face mask oxygen  Post-op Assessment: Report given to RN and Post -op Vital signs reviewed and stable  Post vital signs: Reviewed  Last Vitals:  Vitals:   08/21/17 0645  BP: (!) 143/88  Pulse: 79  Resp: 20  Temp: 36.8 C  SpO2: 97%    Last Pain:  Vitals:   08/21/17 0645  TempSrc: Oral  PainSc:          Complications: No apparent anesthesia complications

## 2017-08-21 NOTE — Op Note (Signed)
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  Postoperative diagnosis: Same  Operative report: Transforaminal lumbar interbody fusion L3-4 and L4-5  First Assistant: Anette Riedel, PA  Complications: None  Intraoperative neuro monitoring throughout the case.  No abnormal SSEPs or abnormal free running EMGs.  All pedicle screws were directly stimulated all stimulated greater than 40 mA except for the left L4 screw which stimulated at 31 mA.  Implants:   1. Titan Nanolock TLIF cage:  L3/4: size 7 extra long.  L4/5: size 8 extra long   2. Pedicle screws: Nuvasive MIS pedicle screws:   R side: 6.5 X 40 (x3)          L side: L3 and L5: 6.5 X 40.  L4: 7.5 X 45 Allograft: conform sheet / DBX  Indications: This is a very pleasant 57 year old woman has been having progressive back buttock and debilitating neuropathic left leg pain.  Attempts at conservative management consisting of physical therapy and injection therapy medications failed to alleviate her symptoms.  As a result of the ongoing problems we elected to proceed with surgery.  All appropriate risks benefits and alternatives were discussed with the patient and consent was obtained.  Operative report the patient was brought to the operating room placed upon the operating room table.  After successful induction of general anesthesia and endotracheally patient teds SCDs and a Foley were inserted.  The neuro monitor rep then applied all the appropriate pads and the patient was turned into the prone position on the Wilson frame.  All bony prominences were well-padded and the back was prepped and draped in a standard fashion.  Timeout was taken to confirm patient procedure and all other important data.  On the right-hand side identified the lateral borders of the L3-4 and 5 pedicles.  These were marked out and then the area was infiltrated with quarter percent Marcaine with  epinephrine.  A small longitudinal incision was made and the Jamshidi needle was advanced percutaneously down to the lateral aspect of the facet complex.  I was able to palpate the transverse process and trace it really back to the lateral wall of the facet.  Once I confirmed satisfactory position in the plane I connected the stimulating device of the Jamshidi needle then advanced the needle into the pedicle of L3.  As I was advancing it towards the medial wall I confirmed trajectory and position with AP fluoroscopy.  Once I was nearing the medial wall of the pedicle I switched to the lateral view.  Once in this view I confirmed I was just beyond the posterior wall of the vertebral body.  At this point I was pleased with the trajectory and positioning and so I advanced the Jamshidi needle into the vertebral body of L3.  I then placed my guidepin through the Jamshidi needle and cannulated the L3 pedicle.  Using this exact same technique I cannulated the L4 and L5 pedicles on the right side.  Once this side was completed I then went to the contralateral side.  Because this was the side of the head the radiculopathy elected to do a mini open Wiltsie approach so that I can insert the T lift cages from the side.  I again identify the lateral borders of the pedicles and then infiltrated this area with quarter percent Marcaine I then made a longitudinal incision and sharply dissected down to the deep fascia.  I incised the deep fascia and then  bluntly dissected through the paraspinal musculature until I could palpate the facet complex.  Using the same technique I used on the right side I cannulated each of the pedicles.  Once all 3 pedicles were cannulated I Then Measured and then placed the screws.  I then directly stimulated each screw and there was no adverse activity at L3 or at L5 at greater than 40 mA.  At L4 because I removed the 6.5 screw and repositioned it with a larger 7.5 that screw stem to positive at  approximately 31 mA.  The retracting blades were secured to the screws heads and I began my decompression of the L4-5 level.  I mobilized the paraspinal muscles medially and connected my medial retracting system so I can expose the lamina and facet complex at L4-5.  I have took down the capsule of the facet complex and then used a osteotome to resect the entire inferior L4 facet.  Once this was out I was able to use my Kerrison rongeur to perform a generous laminotomy L4 and completely take down the L4 pars.  At this point I then resected the superior portion of the L5 facet so that I had excellent visualization of the posterior lateral aspect of the L4-5 disc space.  Identified the L4 nerve root and protected it with a neuro patty and then resected the remaining portion of the pars and the overhanging ligamentum flavum.  At this point I had excellent visualization of the lateral aspect of the thecal sac traversing L5 and exiting L4 nerve root.  Once all these areas were protected I use my bipolar to obtain hemostasis by coagulating the epidural veins.  An annulotomy was then performed with a 15 blade scalpel and then used a combination of pituitary rongeurs and Kerrison rongeurs pituitary rongeurs to affect a near complete discectomy.  At this point I could visualize into the disc space and I could see bleeding subchondral bone.  At this point I trialed the intervertebral space and elected to use the size 8 extra long titanium Titan cage.  This cage was obtained and packed with the autograft that I had harvested from my decompression and I added DBX as a supplement.  I then malleted the cage into the vertical position in the L4-5 disc space.  Using the bone tamps I was able to push the cage into a horizontal position so that it traversed the contralateral side and provided indirect foraminal decompression.  Final x-rays demonstrated satisfactory position of the cage in the anterior third of the disc space.  It  was a well-positioned had good apposition.  At this point I was pleased with the decompression and intervertebral cage placement at L4-5.  I then repositioned my retractor so I could now visualize the L3-4 space.  Using the same technique I performed a laminotomy and facetectomy at this level.  I removed the L3 pars and took down the ligamentum flavum.  I again identified the L3 exiting nerve root and the traversing L4 nerve root.  I then retracted the thecal sac medially and performed an annulotomy with a 15 blade scalpel.  Using the same technique that I used at L4-5 I affected a complete discectomy at L3-4.  I could again visualize the endplates and confirmed to have bleeding subchondral bone.  I again trialed and then placed a size 7 extra long Titan Nano lock cage.  Again this was packed with auto graft and DBX.  At this point time with both cages  in place I confirmed in the AP and lateral planes that I had excellent positioning of those cages.  There was no contact with the posterior neural elements and was well seated into the anterior third of the disc space.  At this point I then removed the retracting devices attached to the screws and applied the polyaxial heads.  I made sure to each of the screws were properly tightened down and then measured and placed a rod across the pedicle screw construct I then locked it with the top locking knots and these were all 3 were torqued off according manufacturer standards.  At this point in satisfactory positioning of the hardware.  Copiously irrigated with normal saline and made sure hemostasis using bipolar electrocautery.  Once I confirmed hemostasis I then placed a thrombin-soaked Gelfoam patty over the exposed laminotomy site and then closed the deep layer with interrupted #1 Vicryl suture.  I then went to the contralateral side and placed the pedicle screws over the guidepins.  Each screw also was stimulated and there was no adverse activity at greater than 40  mA stimulation on the right side.  I then measured and placed a rod and top locking nuts to secured into the pedicle screw construct.  All top nuts were torqued off according manufacturer standards.  At this point I took my final x-rays which demonstrated satisfactory positioning of all 6 pedicle screws and the 2 intervertebral cages.  At this point all wounds were copiously irrigated with normal saline and closed in a layered fashion with interrupted #1 Vicryl suture, 2-0 Vicryl suture, and 3-0 Monocryl.  Steri-Strips dry dressings were applied and the patient was ultimately extubated transferred the PACU without incident.

## 2017-08-21 NOTE — Brief Op Note (Signed)
08/21/2017  2:05 PM  PATIENT:  Shawna Horton  57 y.o. female  PRE-OPERATIVE DIAGNOSIS:  Lumbar degenerative disc disease L3-L5  POST-OPERATIVE DIAGNOSIS:  Lumbar degenerative disc disease L3-L5  PROCEDURE:  Procedure(s) with comments: TRANSFORAMINAL LUMBAR INTERBODY FUSION (TLIF)  L3-4, L4-5 (N/A) - 5 hrs  SURGEON:  Surgeon(s) and Role:    Venita Lick* Puneet Masoner, MD - Primary  PHYSICIAN ASSISTANT:   ASSISTANTS: Carmen Mayo   ANESTHESIA:   general  EBL:  200 mL   BLOOD ADMINISTERED:none  DRAINS: none   LOCAL MEDICATIONS USED:  MARCAINE     SPECIMEN:  No Specimen  DISPOSITION OF SPECIMEN:  N/A  COUNTS:  YES  TOURNIQUET:  * No tourniquets in log *  DICTATION: .Dragon Dictation  PLAN OF CARE: Admit to inpatient   PATIENT DISPOSITION:  PACU - hemodynamically stable.

## 2017-08-21 NOTE — Anesthesia Procedure Notes (Signed)
Procedure Name: Intubation Date/Time: 08/21/2017 8:37 AM Performed by: Jenne Campus, CRNA Pre-anesthesia Checklist: Patient identified, Emergency Drugs available, Suction available and Patient being monitored Patient Re-evaluated:Patient Re-evaluated prior to induction Oxygen Delivery Method: Circle System Utilized Preoxygenation: Pre-oxygenation with 100% oxygen Induction Type: IV induction Ventilation: Mask ventilation without difficulty Laryngoscope Size: Mac and 4 Grade View: Grade III Tube type: Oral Tube size: 7.5 mm Number of attempts: 1 Airway Equipment and Method: Oral airway and Bougie stylet Placement Confirmation: ETT inserted through vocal cords under direct vision,  positive ETCO2 and breath sounds checked- equal and bilateral Secured at: 23 cm Tube secured with: Tape Dental Injury: Teeth and Oropharynx as per pre-operative assessment  Difficulty Due To: Difficulty was unanticipated and Difficult Airway- due to immobile epiglottis Comments: Smooth IV induction. Easy mask. DL x 1 Mil 3 by CRNA. Unable to move or pick up epiglottis. DL x 1 Dr Kalman Shan MAC 4. Grade 3 view. Bougie stylet easily placed and 7.5 ETT passed over Bougie. +ETCO2 BBSE.

## 2017-08-21 NOTE — Discharge Instructions (Signed)
Spinal Fusion, Care After °These instructions give you information about caring for yourself after your procedure. Your doctor may also give you more specific instructions. Call your doctor if you have any problems or questions after your procedure. °Follow these instructions at home: °Medicines °· Take over-the-counter and prescription medicines only as told by your doctor. These include any medicines for pain. °· Do not drive for 24 hours if you received a sedative. °· Do not drive or use heavy machinery while taking prescription pain medicine. °· If you were prescribed an antibiotic medicine, take it as told by your doctor. Do not stop taking the antibiotic even if you start to feel better. °Surgical Cut (Incision) Care °· Follow instructions from your doctor about how to take care of your surgical cut. Make sure you: °? Wash your hands with soap and water before you change your bandage (dressing). If you cannot use soap and water, use hand sanitizer. °? Change your bandage as told by your doctor. °? Leave stitches (sutures), skin glue, or skin tape (adhesive) strips in place. They may need to stay in place for 2 weeks or longer. If tape strips get loose and curl up, you may trim the loose edges. Do not remove tape strips completely unless your doctor says it is okay. °· Keep your surgical cut clean and dry. Do not take baths, swim, or use a hot tub until your doctor says it is okay. °· Check your surgical cut and the area around it every day for: °? Redness. °? Swelling. °? Fluid. °Physical Activity °· Return to your normal activities as told by your doctor. Ask your doctor what activities are safe for you. Rest and protect your back as much as you can. °· Follow instructions from your doctor about how to move. Use good posture to help your spine heal. °· Do not lift anything that is heavier than 8 lb (3.6 kg) or as told by your doctor until he or she says that it is safe. Do not lift anything over your  head. °· Do not twist or bend at the waist until your doctor says it is okay. °· Avoid pushing or pulling motions. °· Do not sit or lie down in the same position for long periods of time. °· Do not start to exercise until your doctor says it is okay. Ask your doctor what kinds of exercise you can do to make your back stronger. °General instructions °· If you were given a brace, use it as told by your doctor. °· Wear compression stockings as told by your doctor. °· Do not use tobacco products. These include cigarettes, chewing tobacco, or e-cigarettes. If you need help quitting, ask your doctor. °· Keep all follow-up visits as told by your doctor. This is important. This includes any visits with your physical therapist, if this applies. °Contact a doctor if: °· Your pain gets worse. °· Your medicine does not help your pain. °· Your legs or feet become painful or swollen. °· Your surgical cut is red, swollen, or painful. °· You have fluid, blood, or pus coming from your surgical cut. °· You feel sick to your stomach (nauseous). °· You throw up (vomit). °· Your have weakness or loss of feeling (numbness) in your legs that is new or getting worse. °· You have a fever. °· You have trouble controlling when you pee (urinate) or poop (have a bowel movement). °Get help right away if: °· Your pain is very bad. °· You have   chest pain. °· You have trouble breathing. °· You start to have a cough. °These symptoms may be an emergency. Do not wait to see if the symptoms will go away. Get medical help right away. Call your local emergency services (911 in the [place] Do not drive yourself to the hospital. °This information is not intended to replace advice given to you by your health care provider. Make sure you discuss any questions you have with your health care provider. °Document Released: 09/21/2010 Document Revised: 01/24/2016 Document Reviewed: 11/10/2014 °Elsevier Interactive Patient Education © 2018 [employer] ° °

## 2017-08-21 NOTE — Anesthesia Preprocedure Evaluation (Addendum)
Anesthesia Evaluation  Patient identified by MRN, date of birth, ID band Patient awake    Reviewed: Allergy & Precautions, NPO status , Patient's Chart, lab work & pertinent test results  Airway Mallampati: II  TM Distance: <3 FB Neck ROM: Full    Dental no notable dental hx. (+) Teeth Intact, Dental Advisory Given   Pulmonary neg pulmonary ROS, Current Smoker,    Pulmonary exam normal breath sounds clear to auscultation       Cardiovascular negative cardio ROS Normal cardiovascular exam Rhythm:Regular Rate:Normal     Neuro/Psych negative neurological ROS  negative psych ROS   GI/Hepatic Neg liver ROS, GERD  ,  Endo/Other  diabetes, Type 2  Renal/GU negative Renal ROS  negative genitourinary   Musculoskeletal negative musculoskeletal ROS (+)   Abdominal   Peds negative pediatric ROS (+)  Hematology negative hematology ROS (+)   Anesthesia Other Findings   Reproductive/Obstetrics negative OB ROS                            Anesthesia Physical Anesthesia Plan  ASA: II  Anesthesia Plan: General   Post-op Pain Management:    Induction: Intravenous  PONV Risk Score and Plan: 3 and Ondansetron, Dexamethasone and Treatment may vary due to age or medical condition  Airway Management Planned: Oral ETT  Additional Equipment:   Intra-op Plan:   Post-operative Plan: Extubation in OR  Informed Consent: I have reviewed the patients History and Physical, chart, labs and discussed the procedure including the risks, benefits and alternatives for the proposed anesthesia with the patient or authorized representative who has indicated his/her understanding and acceptance.   Dental advisory given  Plan Discussed with: CRNA and Surgeon  Anesthesia Plan Comments:         Anesthesia Quick Evaluation

## 2017-08-21 NOTE — Evaluation (Signed)
Physical Therapy Evaluation Patient Details Name: Emeline GinsRamona C Gasca MRN: [id number] DOB: 02/03/1961 Today's Date: 08/21/2017   History of Present Illness  pt is a 57 y/o female with pmh significant for PTSD, DM admitted with back pain due to DDD and radiculopathy, s/p L34, L45 TLIF.  Clinical Impression  Pt admitted with/for lumbar fusion surgery.  .  Pt currently limited functionally due to the problems listed below.  (see problems list.)  Pt will benefit from PT to maximize function and safety to be able to get home safely with available assist .     Follow Up Recommendations No PT follow up    Equipment Recommendations  Rolling walker with 5" wheels;3in1 (PT)    Recommendations for Other Services       Precautions / Restrictions Precautions Precautions: Back Required Braces or Orthoses: Spinal Brace Spinal Brace: Lumbar corset;Applied in sitting position Restrictions Weight Bearing Restrictions: No      Mobility  Bed Mobility Overal bed mobility: Needs Assistance Bed Mobility: Rolling;Sidelying to Sit;Sit to Sidelying Rolling: Min assist Sidelying to sit: Min assist     Sit to sidelying: Min assist General bed mobility comments: cued for technique and practice  Transfers Overall transfer level: Needs assistance Equipment used: Rolling walker (2 wheeled) Transfers: Sit to/from Stand Sit to Stand: Min assist         General transfer comment: cues for hand placement  Ambulation/Gait Ambulation/Gait assistance: Min guard Ambulation Distance (Feet): 45 Feet Assistive device: Rolling walker (2 wheeled) Gait Pattern/deviations: Step-through pattern   Gait velocity interpretation: Below normal speed for age/gender General Gait Details: guarded gait, heavy use of the RW  Stairs            Wheelchair Mobility    Modified Rankin (Stroke Patients Only)       Balance Overall balance assessment: Needs assistance   Sitting balance-Leahy Scale: Fair       Standing balance-Leahy Scale: Fair                               Pertinent Vitals/Pain Pain Assessment: 0-10 Pain Score: 9  Pain Location: back Pain Descriptors / Indicators: Discomfort;Guarding;Grimacing Pain Intervention(s): Monitored during session;Limited activity within patient's tolerance    Home Living Family/patient expects to be discharged to:: Private residence Living Arrangements: Parent Available Help at Discharge: Family;Friend(s);Available PRN/intermittently Type of Home: House Home Access: Stairs to enter     Home Layout: One level Home Equipment: None      Prior Function Level of Independence: Independent               Hand Dominance        Extremity/Trunk Assessment   Upper Extremity Assessment Upper Extremity Assessment: Overall WFL for tasks assessed    Lower Extremity Assessment Lower Extremity Assessment: Overall WFL for tasks assessed       Communication   Communication: No difficulties  Cognition Arousal/Alertness: Awake/alert Behavior During Therapy: WFL for tasks assessed/performed Overall Cognitive Status: Within Functional Limits for tasks assessed                                        General Comments General comments (skin integrity, edema, etc.): pt/friend instructed in back care/prec, log roll, bracing issues, lifting restrictions and progression of activity.    Exercises     Assessment/Plan  PT Assessment Patient needs continued PT services  PT Problem List Decreased activity tolerance;Decreased mobility;Decreased knowledge of use of DME;Decreased knowledge of precautions;Pain       PT Treatment Interventions DME instruction;Gait training;Stair training;Functional mobility training;Therapeutic activities;Patient/family education    PT Goals (Current goals can be found in the Care Plan section)  Acute Rehab PT Goals Patient Stated Goal: independence PT Goal Formulation: With  patient Time For Goal Achievement: 08/28/17 Potential to Achieve Goals: Good    Frequency Min 5X/week   Barriers to discharge        Co-evaluation               AM-PAC PT "6 Clicks" Daily Activity  Outcome Measure Difficulty turning over in bed (including adjusting bedclothes, sheets and blankets)?: Unable Difficulty moving from lying on back to sitting on the side of the bed? : Unable Difficulty sitting down on and standing up from a chair with arms (e.g., wheelchair, bedside commode, etc,.)?: A Little Help needed moving to and from a bed to chair (including a wheelchair)?: A Little Help needed walking in hospital room?: A Little Help needed climbing 3-5 steps with a railing? : A Little 6 Click Score: 14    End of Session Equipment Utilized During Treatment: Back brace Activity Tolerance: Patient tolerated treatment well;Patient limited by pain Patient left: in bed;with call bell/phone within reach;with family/visitor present Nurse Communication: Mobility status PT Visit Diagnosis: Other abnormalities of gait and mobility (R26.89);Pain Pain - part of body: (back)    Time: 1914-7829 PT Time Calculation (min) (ACUTE ONLY): 38 min   Charges:   PT Evaluation $PT Eval Low Complexity: 1 Low PT Treatments $Gait Training: 8-22 mins $Therapeutic Activity: 8-22 mins   PT G Codes:        09-12-17  Barry Bing, PT [phone]  (pager)  Eliseo Gum Haward Pope 09-12-2017, 11:11 PM

## 2017-08-22 LAB — GLUCOSE, CAPILLARY
GLUCOSE-CAPILLARY: 115 mg/dL — AB (ref 65–99)
Glucose-Capillary: 128 mg/dL — ABNORMAL HIGH (ref 65–99)
Glucose-Capillary: 129 mg/dL — ABNORMAL HIGH (ref 65–99)
Glucose-Capillary: 138 mg/dL — ABNORMAL HIGH (ref 65–99)

## 2017-08-22 LAB — HEMOGLOBIN A1C
HEMOGLOBIN A1C: 7 % — AB (ref 4.8–5.6)
Mean Plasma Glucose: 154.2 mg/dL

## 2017-08-22 MED ORDER — INSULIN ASPART 100 UNIT/ML ~~LOC~~ SOLN
0.0000 [IU] | Freq: Three times a day (TID) | SUBCUTANEOUS | Status: DC
  Administered 2017-08-22 – 2017-08-23 (×3): 2 [IU] via SUBCUTANEOUS

## 2017-08-22 MED ORDER — INSULIN ASPART 100 UNIT/ML ~~LOC~~ SOLN: 0.0000 [IU] | Freq: Every day | SUBCUTANEOUS | Status: DC

## 2017-08-22 MED FILL — Thrombin (Recombinant) For Soln 20000 Unit: CUTANEOUS | Qty: 1 | Status: AC

## 2017-08-22 NOTE — Anesthesia Postprocedure Evaluation (Signed)
Anesthesia Post Note  Patient: Shawna Horton  Procedure(s) Performed: TRANSFORAMINAL LUMBAR INTERBODY FUSION (TLIF)  L3-4, L4-5 (N/A Spine Lumbar)     Patient location during evaluation: PACU Anesthesia Type: General Level of consciousness: awake and alert Pain management: pain level controlled Vital Signs Assessment: post-procedure vital signs reviewed and stable Respiratory status: spontaneous breathing, nonlabored ventilation, respiratory function stable and patient connected to nasal cannula oxygen Cardiovascular status: blood pressure returned to baseline and stable Postop Assessment: no apparent nausea or vomiting Anesthetic complications: no    Last Vitals:  Vitals:   08/22/17 0400 08/22/17 0750  BP: (!) 113/54 101/60  Pulse: 83 79  Resp: 18 16  Temp: 37.3 C 37.3 C  SpO2: 96% 96%    Last Pain:  Vitals:   08/22/17 0618  TempSrc:   PainSc: 7                  Marelin Tat S

## 2017-08-22 NOTE — Progress Notes (Signed)
    Subjective: Procedure(s) (LRB): TRANSFORAMINAL LUMBAR INTERBODY FUSION (TLIF)  L3-4, L4-5 (N/A) 1 Day Post-Op  Patient reports pain as 3 on 0-10 scale.  Reports decreased leg pain reports incisional back pain   Positive void Negative bowel movement Positive flatus Negative chest pain or shortness of breath  Objective: Vital signs in last 24 hours: Temp:  [97.4 F (36.3 C)-99.8 F (37.7 C)] 99.1 F (37.3 C) (03/14 0400) Pulse Rate:  [82-105] 83 (03/14 0400) Resp:  [12-22] 18 (03/14 0400) BP: (113-177)/(54-103) 113/54 (03/14 0400) SpO2:  [95 %-99 %] 96 % (03/14 0400)  Intake/Output from previous day: 03/13 0701 - 03/14 0700 In: 4180 [P.O.:480; I.V.:3700] Out: 1000 [Urine:800; Blood:200]  Labs: No results for input(s): WBC, RBC, HCT, PLT in the last 72 hours. No results for input(s): NA, K, CL, CO2, BUN, CREATININE, GLUCOSE, CALCIUM in the last 72 hours. No results for input(s): LABPT, INR in the last 72 hours.  Physical Exam: Neurologically intact ABD soft Intact pulses distally Incision: dressing C/D/I Compartment soft There is no height or weight on file to calculate BMI.   Assessment/Plan: Patient stable  xrays n/a Continue mobilization with physical therapy Continue care  Advance diet Up with therapy  Doing well overall - pain well controlled Possible d/c tomorrow  Venita Lickahari Kaeleigh Westendorf, MD [facility] [phone]

## 2017-08-22 NOTE — Progress Notes (Signed)
OT Cancellation Note  Patient Details Name: Shawna Horton MRN: [id number] DOB: 13-Mar-1961   Cancelled Treatment:    Reason Eval/Treat Not Completed: Pain limiting ability to participate. Pt reporting 10/10 pain. Plan to reattempt later today.   Raynald KempKathryn Amar Keenum OTR/L Pager: [phone]  08/22/2017, 11:08 AM

## 2017-08-22 NOTE — Progress Notes (Signed)
Physical Therapy Treatment Patient Details Name: Shawna Horton MRN: [id number] DOB: 1960-10-29 Today's Date: 08/22/2017    History of Present Illness pt is a 57 y/o female with pmh significant for PTSD, DM admitted with back pain due to DDD and radiculopathy, s/p L34, L45 TLIF.    PT Comments    Patient progressing with ambulation and able to practice stairs for accessing bedroom on lower level of home.  Caregiver present and also educated in level of assist for stairs.  Patient maintaining precautions with mobility this session.  Also educated in accessing truck for transportation home with back precautions.  Will follow up in AM prior to d/c.     Follow Up Recommendations  No PT follow up     Equipment Recommendations  Rolling walker with 5" wheels;3in1 (PT)    Recommendations for Other Services       Precautions / Restrictions Precautions Precautions: Back Required Braces or Orthoses: Spinal Brace Spinal Brace: Lumbar corset;Applied in standing position Restrictions Weight Bearing Restrictions: No    Mobility  Bed Mobility   Bed Mobility: Sit to Sidelying Rolling: Supervision       Sit to sidelying: Supervision General bed mobility comments: gesturing/visual cues  Transfers Overall transfer level: Needs assistance Equipment used: Rolling walker (2 wheeled) Transfers: Sit to/from Stand Sit to Stand: Supervision         General transfer comment: cues for hand placement  Ambulation/Gait Ambulation/Gait assistance: Supervision Ambulation Distance (Feet): 120 Feet Assistive device: Rolling walker (2 wheeled) Gait Pattern/deviations: Step-through pattern;Decreased stride length     General Gait Details: step to technique initially, improved with cues to step throught more fluid pattern   Stairs Stairs: Yes   Stair Management: One rail Left;Step to pattern;Sideways(went down first) Number of Stairs: 5 General stair comments: minguard for safety, partner  present and educated how to assist and manage walker  Wheelchair Mobility    Modified Rankin (Stroke Patients Only)       Balance Overall balance assessment: Needs assistance   Sitting balance-Leahy Scale: Good       Standing balance-Leahy Scale: Fair                              Cognition Arousal/Alertness: Awake/alert Behavior During Therapy: WFL for tasks assessed/performed Overall Cognitive Status: Within Functional Limits for tasks assessed                                        Exercises      General Comments General comments (skin integrity, edema, etc.): reviewed precautions and walking progression      Pertinent Vitals/Pain Pain Score: 8  Pain Location: sides of hips down to thighs Pain Descriptors / Indicators: Aching Pain Intervention(s): Monitored during session;Repositioned    Home Living                      Prior Function            PT Goals (current goals can now be found in the care plan section) Progress towards PT goals: Progressing toward goals    Frequency    Min 5X/week      PT Plan Current plan remains appropriate    Co-evaluation              AM-PAC PT "6 Clicks" Daily Activity  Outcome Measure  Difficulty turning over in bed (including adjusting bedclothes, sheets and blankets)?: A Lot Difficulty moving from lying on back to sitting on the side of the bed? : A Lot Difficulty sitting down on and standing up from a chair with arms (e.g., wheelchair, bedside commode, etc,.)?: A Little Help needed moving to and from a bed to chair (including a wheelchair)?: A Little Help needed walking in hospital room?: A Little Help needed climbing 3-5 steps with a railing? : A Little 6 Click Score: 16    End of Session Equipment Utilized During Treatment: Back brace Activity Tolerance: Patient limited by pain Patient left: in bed;with call bell/phone within reach   PT Visit Diagnosis: Other  abnormalities of gait and mobility (R26.89);Pain Pain - Right/Left: (back)     Time: 1005-1030 PT Time Calculation (min) (ACUTE ONLY): 25 min  Charges:  $Gait Training: 8-22 mins $Therapeutic Activity: 8-22 mins                    G CodesSheran Lawless, [place] [phone] 08/22/2017    [name] 08/22/2017, 10:43 AM

## 2017-08-22 NOTE — Progress Notes (Signed)
Pt received RW and 3-n-1 from [facility] per MD order. Govind Furey, RN  

## 2017-08-23 ENCOUNTER — Encounter (HOSPITAL_COMMUNITY): Payer: Self-pay | Admitting: Orthopedic Surgery

## 2017-08-23 LAB — GLUCOSE, CAPILLARY
Glucose-Capillary: 128 mg/dL — ABNORMAL HIGH (ref 65–99)
Glucose-Capillary: 138 mg/dL — ABNORMAL HIGH (ref 65–99)

## 2017-08-23 NOTE — Progress Notes (Signed)
Spoke w patient and family at bedside. They are interested in Personal care services and a personal aid to assist with ADLs, cleaning and meal prep everyday. [facility] and it would need to be arranged by the PCP. They verbalized understanding.

## 2017-08-23 NOTE — Progress Notes (Signed)
Physical Therapy Treatment Patient Details Name: Shawna Horton MRN: [id number] DOB: Mar 06, 1961 Today's Date: 08/23/2017    History of Present Illness pt is a 57 y/o female with pmh significant for PTSD, DM admitted with back pain due to DDD and radiculopathy, s/p L34, L45 TLIF.    PT Comments    Pt progressing towards physical therapy goals. Pt received standing in room with friend donning brace for pt. Was able to perform transfers and ambulation with gross supervision for safety. Pt guarded and overall moving slowly with all aspects of mobility at this time. Stair training provided with friend present for education. Pt and friend educated on brace application/wearing schedule, car transfer, precautions, and general safety with activity progression. Will continue to follow and progress as able per POC.   Follow Up Recommendations  No PT follow up     Equipment Recommendations  Rolling walker with 5" wheels;3in1 (PT)    Recommendations for Other Services       Precautions / Restrictions Precautions Precautions: Back Precaution Comments: handout provide dand reviewed in detail for back precautions with adls Required Braces or Orthoses: Spinal Brace Spinal Brace: Lumbar corset;Applied in standing position Restrictions Weight Bearing Restrictions: No    Mobility  Bed Mobility Overal bed mobility: Needs Assistance Bed Mobility: Rolling;Sit to Sidelying Rolling: Supervision       Sit to sidelying: Supervision General bed mobility comments: VC's for proper log roll technique.   Transfers Overall transfer level: Needs assistance Equipment used: Rolling walker (2 wheeled) Transfers: Sit to/from Stand Sit to Stand: Supervision         General transfer comment: VC's for hand placement on seated surface for safety.   Ambulation/Gait Ambulation/Gait assistance: Supervision Ambulation Distance (Feet): 175 Feet Assistive device: Rolling walker (2 wheeled) Gait  Pattern/deviations: Step-through pattern;Decreased stride length Gait velocity: Decreased Gait velocity interpretation: Below normal speed for age/gender General Gait Details: Slow and guarded. Pt leaning heavily on RW for support, and not making corrective changes to improve posture.    Stairs Stairs: Yes   Stair Management: Step to pattern;Sideways;One rail Right;Forwards Number of Stairs: 5 General stair comments: HHA on L and railing use on right to simulate home environment. Pt with increased difficulty advancing with LLE sideways so encouraged forward negotiation of stairs with HHA on the L so pt would not have to cross feet on stairs to advance.   Wheelchair Mobility    Modified Rankin (Stroke Patients Only)       Balance Overall balance assessment: Needs assistance Sitting-balance support: No upper extremity supported Sitting balance-Leahy Scale: Good     Standing balance support: Bilateral upper extremity supported Standing balance-Leahy Scale: Fair                              Cognition Arousal/Alertness: Awake/alert Behavior During Therapy: WFL for tasks assessed/performed Overall Cognitive Status: Within Functional Limits for tasks assessed                                        Exercises      General Comments        Pertinent Vitals/Pain Pain Assessment: Faces Pain Score: 9  Faces Pain Scale: Hurts even more Pain Location: sides of hips down to thighs Pain Descriptors / Indicators: Aching Pain Intervention(s): Monitored during session    Home Living Family/patient expects  to be discharged to:: Private residence Living Arrangements: Parent Available Help at Discharge: Family;Friend(s);Available PRN/intermittently Type of Home: House Home Access: Stairs to enter   Home Layout: One level Home Equipment: None Additional Comments: brother and sister to (A) as needed. Father lives within th e home too. Brother has already  taken 3n1 home    Prior Function Level of Independence: Independent          PT Goals (current goals can now be found in the care plan section) Acute Rehab PT Goals Patient Stated Goal: independence PT Goal Formulation: With patient Time For Goal Achievement: 08/28/17 Potential to Achieve Goals: Good Progress towards PT goals: Progressing toward goals    Frequency    Min 5X/week      PT Plan Current plan remains appropriate    Co-evaluation              AM-PAC PT "6 Clicks" Daily Activity  Outcome Measure  Difficulty turning over in bed (including adjusting bedclothes, sheets and blankets)?: A Little Difficulty moving from lying on back to sitting on the side of the bed? : A Little Difficulty sitting down on and standing up from a chair with arms (e.g., wheelchair, bedside commode, etc,.)?: A Little Help needed moving to and from a bed to chair (including a wheelchair)?: A Little Help needed walking in hospital room?: A Little Help needed climbing 3-5 steps with a railing? : A Little 6 Click Score: 18    End of Session Equipment Utilized During Treatment: Back brace Activity Tolerance: Patient limited by pain Patient left: in bed;with call bell/phone within reach Nurse Communication: Mobility status PT Visit Diagnosis: Other abnormalities of gait and mobility (R26.89);Pain Pain - part of body: (back)     Time: [id number] PT Time Calculation (min) (ACUTE ONLY): 15 min  Charges:  $Gait Training: 8-22 mins                    G Codes:       Conni SlipperLaura Cayne Yom, PT, DPT Acute Rehabilitation Services Pager: [phone]    Marylynn PearsonLaura D Niles Ess 08/23/2017, 3:05 PM

## 2017-08-23 NOTE — Progress Notes (Signed)
Patient alert and oriented, mae's well, voiding adequate amount of urine, swallowing without difficulty,  C/o mild  pain at time of discharge. Patient discharged home with family. Script and discharged instructions given to patient. Patient and family stated understanding of instructions given. Patient has an appointment with Dr. Brooks    

## 2017-08-23 NOTE — Evaluation (Signed)
Occupational Therapy Evaluation Patient Details Name: Shawna Horton MRN: [id number] DOB: 1960-08-26 Today's Date: 08/23/2017    History of Present Illness pt is a 57 y/o female with pmh significant for PTSD, DM admitted with back pain due to DDD and radiculopathy, s/p L34, L45 TLIF.   Clinical Impression   Patient evaluated by Occupational Therapy with no further acute OT needs identified. All education has been completed and the patient has no further questions. See below for any follow-up Occupational Therapy or equipment needs. OT to sign off. Thank you for referral.      Follow Up Recommendations  No OT follow up    Equipment Recommendations  Other (comment)(3n1 already delivered)    Recommendations for Other Services       Precautions / Restrictions Precautions Precautions: Back Precaution Comments: handout provide dand reviewed in detail for back precautions with adls Required Braces or Orthoses: Spinal Brace Spinal Brace: Lumbar corset;Applied in standing position Restrictions Weight Bearing Restrictions: No      Mobility Bed Mobility Overal bed mobility: Needs Assistance Bed Mobility: Sit to Sidelying Rolling: Supervision         General bed mobility comments: cues for safety with back precautions   Transfers Overall transfer level: Needs assistance Equipment used: Rolling walker (2 wheeled) Transfers: Sit to/from Stand Sit to Stand: Supervision         General transfer comment: educated on body  sequence    Balance Overall balance assessment: Needs assistance   Sitting balance-Leahy Scale: Good       Standing balance-Leahy Scale: Fair                             ADL either performed or assessed with clinical judgement   ADL Overall ADL's : Modified independent                                       General ADL Comments: able to cross bil le and touch feet, demonstrates shower transfer and return demo.     Back handout provided and reviewed adls in detail. Pt educated on: clothing between brace, never sleep in brace, set an alarm at night for medication, avoid sitting for long periods of time, correct bed positioning for sleeping, correct sequence for bed mobility, avoiding lifting more than 5 pounds and never wash directly over incision. All education is complete and patient indicates understanding.    Vision Baseline Vision/History: No visual deficits       Perception     Praxis      Pertinent Vitals/Pain Pain Assessment: 0-10 Pain Score: 9  Pain Location: sides of hips down to thighs Pain Descriptors / Indicators: Aching Pain Intervention(s): Monitored during session;Repositioned;Other (comment)(RN notified and pending medication)     Hand Dominance Right   Extremity/Trunk Assessment Upper Extremity Assessment Upper Extremity Assessment: Overall WFL for tasks assessed   Lower Extremity Assessment Lower Extremity Assessment: Defer to PT evaluation   Cervical / Trunk Assessment Cervical / Trunk Assessment: Other exceptions Cervical / Trunk Exceptions: s/p surg   Communication Communication Communication: No difficulties   Cognition Arousal/Alertness: Awake/alert Behavior During Therapy: WFL for tasks assessed/performed Overall Cognitive Status: Within Functional Limits for tasks assessed  General Comments       Exercises     Shoulder Instructions      Home Living Family/patient expects to be discharged to:: Private residence Living Arrangements: Parent Available Help at Discharge: Family;Friend(s);Available PRN/intermittently Type of Home: House Home Access: Stairs to enter     Home Layout: One level     Bathroom Shower/Tub: Chief Strategy Officer: Standard     Home Equipment: None   Additional Comments: brother and sister to (A) as needed. Father lives within th e home too. Brother has  already taken 3n1 home      Prior Functioning/Environment Level of Independence: Independent                 OT Problem List:        OT Treatment/Interventions:      OT Goals(Current goals can be found in the care plan section) Acute Rehab OT Goals Patient Stated Goal: independence  OT Frequency:     Barriers to D/C:            Co-evaluation              AM-PAC PT "6 Clicks" Daily Activity     Outcome Measure Help from another person eating meals?: None Help from another person taking care of personal grooming?: None Help from another person toileting, which includes using toliet, bedpan, or urinal?: None Help from another person bathing (including washing, rinsing, drying)?: None Help from another person to put on and taking off regular upper body clothing?: None Help from another person to put on and taking off regular lower body clothing?: None 6 Click Score: 24   End of Session Equipment Utilized During Treatment: Gait belt;Rolling walker Nurse Communication: Mobility status;Precautions  Activity Tolerance: Patient tolerated treatment well Patient left: in bed;with call bell/phone within reach  OT Visit Diagnosis: Unsteadiness on feet (R26.81)                Time: 1610-9604 OT Time Calculation (min): 23 min Charges:  OT General Charges $OT Visit: 1 Visit OT Evaluation $OT Eval Moderate Complexity: 1 Mod G-Codes:      Mateo Flow   OTR/L Pager: [phone] Office: [phone] .   Boone Master B 08/23/2017, 12:41 PM

## 2017-08-23 NOTE — Progress Notes (Signed)
    Subjective: Procedure(s) (LRB): TRANSFORAMINAL LUMBAR INTERBODY FUSION (TLIF)  L3-4, L4-5 (N/A) 2 Days Post-Op  Patient reports pain as 4 on 0-10 scale.  Reports decreased leg pain reports incisional back pain   Positive void Positive bowel movement Positive flatus Negative chest pain or shortness of breath  Objective: Vital signs in last 24 hours: Temp:  [98.8 F (37.1 C)-100.9 F (38.3 C)] 98.8 F (37.1 C) (03/15 0805) Pulse Rate:  [81-95] 81 (03/15 0805) Resp:  [16-18] 16 (03/15 0805) BP: (131-154)/(67-83) 131/67 (03/15 0805) SpO2:  [97 %-100 %] 99 % (03/15 0805)  Intake/Output from previous day: No intake/output data recorded.  Labs: No results for input(s): WBC, RBC, HCT, PLT in the last 72 hours. No results for input(s): NA, K, CL, CO2, BUN, CREATININE, GLUCOSE, CALCIUM in the last 72 hours. No results for input(s): LABPT, INR in the last 72 hours.  Physical Exam: Neurologically intact ABD soft Intact pulses distally Incision: dressing C/D/I Compartment soft There is no height or weight on file to calculate BMI.   Assessment/Plan: Patient stable  xrays n/a Continue mobilization with physical therapy Continue care  Advance diet Up with therapy Plan for discharge tomorrow  Venita Lickahari Shakiah Wester, MD [facility] [phone]

## 2017-08-23 NOTE — Progress Notes (Signed)
PT Cancellation Note  Patient Details Name: Emeline GinsRamona C Spizzirri MRN: [id number] DOB: August 19, 1960   Cancelled Treatment:    Reason Eval/Treat Not Completed: Patient declined, no reason specified. Wants to rest at this time. Will check back as schedule allows.    Marylynn PearsonLaura D Sapphire Tygart 08/23/2017, 10:59 AM   Conni SlipperLaura Samiya Mervin, PT, DPT Acute Rehabilitation Services Pager: [phone]

## 2017-08-26 NOTE — Discharge Summary (Signed)
Physician Discharge Summary  Patient ID: Shawna Horton MRN: [id number] DOB/AGE: 1961/01/19 57 y.o.  Admit date: 08/21/2017 Discharge date: 08/23/17  Admission Diagnoses:  Lumbar DDD and radiculopathy   Discharge Diagnoses:  Active Problems:   S/P lumbar fusion   Past Medical History:  Diagnosis Date  . Anxiety   . Arthritis   . Asthma   . Back pain, chronic   . Bursitis   . Depression   . Diabetes mellitus without complication (HCC)   . GERD (gastroesophageal reflux disease)   . Hyperlipemia   . PTSD (post-traumatic stress disorder)     Surgeries: Procedure(s): TRANSFORAMINAL LUMBAR INTERBODY FUSION (TLIF)  L3-4, L4-5 on 08/21/2017   Consultants (if any):   Discharged Condition: Improved  Hospital Course: Shawna Horton is an 57 y.o. female who was admitted 08/21/2017 with a diagnosis of Lumbar DDD and radiculopathy and went to the operating room on 08/21/2017 and underwent the above named procedures.  Post op day 2 pt reports moderate pain controlled on oral medication. She reports incisional pain and decreased leg pain. Pt is ambulating in hallway.  Pt is urinating w/o difficulty. Pt is cleared by PT for DC.   She was given perioperative antibiotics:  Anti-infectives (From admission, onward)   Start     Dose/Rate Route Frequency Ordered Stop   08/21/17 2000  ceFAZolin (ANCEF) IVPB 2g/100 mL premix     2 g 200 mL/hr over 30 Minutes Intravenous Every 8 hours 08/21/17 1614 08/22/17 0400   08/21/17 0636  ceFAZolin (ANCEF) 2-4 GM/100ML-% IVPB    Comments:  Rosenberger, Meredit: cabinet override      08/21/17 0636 08/21/17 0845   08/21/17 0634  ceFAZolin (ANCEF) IVPB 2g/100 mL premix     2 g 200 mL/hr over 30 Minutes Intravenous 30 min pre-op 08/21/17 0634 08/21/17 1300    .  She was given sequential compression devices, early ambulation, and TED for DVT prophylaxis.  She benefited maximally from the hospital stay and there were no complications.    Recent vital signs:   Vitals:   08/23/17 0332 08/23/17 0805  BP: (!) 154/73 131/67  Pulse: 95 81  Resp: 18 16  Temp: (!) 100.9 F (38.3 C) 98.8 F (37.1 C)  SpO2: 97% 99%    Recent laboratory studies:  Lab Results  Component Value Date   HGB 13.1 08/12/2017   HGB 12.9 08/30/2015   Lab Results  Component Value Date   WBC 5.2 08/12/2017   PLT 290 08/12/2017   No results found for: INR Lab Results  Component Value Date   NA 140 08/12/2017   K 3.9 08/12/2017   CL 107 08/12/2017   CO2 21 (L) 08/12/2017   BUN 10 08/12/2017   CREATININE 0.79 08/12/2017   GLUCOSE 114 (H) 08/12/2017    Discharge Medications:   Allergies as of 08/23/2017   No Known Allergies     Medication List    STOP taking these medications   ibuprofen 800 MG tablet Commonly known as:  ADVIL,MOTRIN   tiZANidine 4 MG tablet Commonly known as:  ZANAFLEX     TAKE these medications   albuterol 108 (90 Base) MCG/ACT inhaler Commonly known as:  PROVENTIL HFA;VENTOLIN HFA Inhale 1-2 puffs into the lungs every 6 (six) hours as needed for wheezing or shortness of breath.   atorvastatin 40 MG tablet Commonly known as:  LIPITOR Take 40 mg by mouth at bedtime.   benzonatate 100 MG capsule Commonly known as:  TESSALON  Take 1 capsule (100 mg total) by mouth 3 (three) times daily as needed for cough.   metFORMIN 1000 MG tablet Commonly known as:  GLUCOPHAGE Take 1,000 mg by mouth 2 (two) times daily.   methocarbamol 500 MG tablet Commonly known as:  ROBAXIN Take 1 tablet (500 mg total) by mouth 3 (three) times daily.   omeprazole 20 MG capsule Commonly known as:  PRILOSEC Take 20 mg by mouth daily.   ondansetron 4 MG disintegrating tablet Commonly known as:  ZOFRAN ODT Take 1 tablet (4 mg total) by mouth every 8 (eight) hours as needed for nausea or vomiting.   oxyCODONE-acetaminophen 10-325 MG tablet Commonly known as:  PERCOCET Take 1 tablet by mouth every 4 (four) hours as needed for pain.   polyvinyl  alcohol 1.4 % ophthalmic solution Commonly known as:  LIQUIFILM TEARS Place 1 drop into both eyes 2 (two) times daily.   prazosin 1 MG capsule Commonly known as:  MINIPRESS Take 1 mg by mouth at bedtime.   sertraline 100 MG tablet Commonly known as:  ZOLOFT Take 100 mg by mouth 2 (two) times daily.   XALATAN 0.005 % ophthalmic solution Generic drug:  latanoprost Place 1 drop into the right eye at bedtime.       Diagnostic Studies: Dg Lumbar Spine 2-3 Views  Result Date: 08/21/2017 CLINICAL DATA:  L3-L5 lumbar fixation EXAM: DG C-ARM 61-120 MIN; LUMBAR SPINE - 2-3 VIEW COMPARISON:  None. FLUOROSCOPY TIME:  Radiation Exposure Index (as provided by the fluoroscopic device): Not available If the device does not provide the exposure index: Fluoroscopy Time:  7 minutes 3 seconds Number of Acquired Images:  3 FINDINGS: Interbody fusion is noted at L3-4 and L4-5 with pedicle screws and posterior fixation. IMPRESSION: Lumbar fusion from L3-L5. Electronically Signed   By: Alcide Clever M.D.   On: 08/21/2017 13:58   Dg C-arm 1-60 Min  Result Date: 08/21/2017 CLINICAL DATA:  L3-L5 lumbar fixation EXAM: DG C-ARM 61-120 MIN; LUMBAR SPINE - 2-3 VIEW COMPARISON:  None. FLUOROSCOPY TIME:  Radiation Exposure Index (as provided by the fluoroscopic device): Not available If the device does not provide the exposure index: Fluoroscopy Time:  7 minutes 3 seconds Number of Acquired Images:  3 FINDINGS: Interbody fusion is noted at L3-4 and L4-5 with pedicle screws and posterior fixation. IMPRESSION: Lumbar fusion from L3-L5. Electronically Signed   By: Alcide Clever M.D.   On: 08/21/2017 13:58   Dg C-arm 1-60 Min  Result Date: 08/21/2017 CLINICAL DATA:  L3-L5 lumbar fixation EXAM: DG C-ARM 61-120 MIN; LUMBAR SPINE - 2-3 VIEW COMPARISON:  None. FLUOROSCOPY TIME:  Radiation Exposure Index (as provided by the fluoroscopic device): Not available If the device does not provide the exposure index: Fluoroscopy Time:  7  minutes 3 seconds Number of Acquired Images:  3 FINDINGS: Interbody fusion is noted at L3-4 and L4-5 with pedicle screws and posterior fixation. IMPRESSION: Lumbar fusion from L3-L5. Electronically Signed   By: Alcide Clever M.D.   On: 08/21/2017 13:58   Dg C-arm 1-60 Min  Result Date: 08/21/2017 CLINICAL DATA:  L3-L5 lumbar fixation EXAM: DG C-ARM 61-120 MIN; LUMBAR SPINE - 2-3 VIEW COMPARISON:  None. FLUOROSCOPY TIME:  Radiation Exposure Index (as provided by the fluoroscopic device): Not available If the device does not provide the exposure index: Fluoroscopy Time:  7 minutes 3 seconds Number of Acquired Images:  3 FINDINGS: Interbody fusion is noted at L3-4 and L4-5 with pedicle screws and posterior fixation. IMPRESSION:  Lumbar fusion from L3-L5. Electronically Signed   By: Alcide Clever M.D.   On: 08/21/2017 13:58    Disposition:   Discharge Instructions    Incentive spirometry RT   Complete by:  As directed      Pt will present to clinic for 2 week post op appt Post op medication is provided  Follow-up Information    Venita Lick, MD Follow up in 2 week(s).   Specialty:  Orthopedic Surgery Contact information: [address] [place] [address] [phone]            Signed: Kirt Boys 08/26/2017, 10:21 AM

## 2018-08-15 ENCOUNTER — Telehealth: Payer: Self-pay | Admitting: Nurse Practitioner

## 2018-08-15 ENCOUNTER — Other Ambulatory Visit: Payer: Self-pay | Admitting: Orthopedic Surgery

## 2018-08-15 DIAGNOSIS — M5136 Other intervertebral disc degeneration, lumbar region: Secondary | ICD-10-CM

## 2018-08-15 NOTE — Telephone Encounter (Signed)
Phone call to patient to verify medication list and allergies for myelogram procedure. Pt aware she will not need to hold any medications for this procedure. 

## 2018-08-26 ENCOUNTER — Inpatient Hospital Stay: Admission: RE | Admit: 2018-08-26 | Payer: Non-veteran care | Source: Ambulatory Visit

## 2018-08-26 ENCOUNTER — Other Ambulatory Visit: Payer: Non-veteran care

## 2018-09-09 ENCOUNTER — Inpatient Hospital Stay: Admission: RE | Admit: 2018-09-09 | Payer: Non-veteran care | Source: Ambulatory Visit

## 2018-11-04 ENCOUNTER — Telehealth: Payer: Self-pay
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
## 2018-12-08 NOTE — Discharge Instructions (Signed)
Myelogram Discharge Instructions  1. Go home and rest quietly for the next 24 hours.  It is important to lie flat for the next 24 hours.  Get up only to go to the restroom.  You may lie in the bed or on a couch on your back, your stomach, your left side or your right side.  You may have one pillow under your head.  You may have pillows between your knees while you are on your side or under your knees while you are on your back.  2. DO NOT drive today.  Recline the seat as far back as it will go, while still wearing your seat belt, on the way home.  3. You may get up to go to the bathroom as needed.  You may sit up for 10 minutes to eat.  You may resume your normal diet and medications unless otherwise indicated.  Drink lots of extra fluids today and tomorrow.  4. The incidence of headache, nausea, or vomiting is about 5% (one in 20 patients).  If you develop a headache, lie flat and drink plenty of fluids until the headache goes away.  Caffeinated beverages may be helpful.  If you develop severe nausea and vomiting or a headache that does not go away with flat bed rest, call [phone].  5. You may resume normal activities after your 24 hours of bed rest is over; however, do not exert yourself strongly or do any heavy lifting tomorrow. If when you get up you have a headache when standing, go back to bed and force fluids for another 24 hours.  6. Call your physician for a follow-up appointment.  The results of your myelogram will be sent directly to your physician by the following day.  7. If you have any questions or if complications develop after you arrive home, please call [phone].  Discharge instructions have been explained to the patient.  The patient, or the person responsible for the patient, fully understands these instructions.  YOU MAY RESTART YOUR ZOLOFT TOMORROW 12/10/2018 AT 12:40PM.

## 2018-12-09 ENCOUNTER — Ambulatory Visit
Admission: RE | Admit: 2018-12-09 | Discharge: 2018-12-09 | Disposition: A | Discharge status: Home / Self Care | Payer: Medicare HMO | Source: Ambulatory Visit | Attending: Orthopedic Surgery | Admitting: Orthopedic Surgery

## 2018-12-09 VITALS — BP 136/83 | HR 76

## 2018-12-09 DIAGNOSIS — M5136 Other intervertebral disc degeneration, lumbar region: Secondary | ICD-10-CM

## 2018-12-09 DIAGNOSIS — M5416 Radiculopathy, lumbar region: Secondary | ICD-10-CM

## 2018-12-09 DIAGNOSIS — Z981 Arthrodesis status: Secondary | ICD-10-CM

## 2018-12-09 MED ORDER — DIAZEPAM 5 MG PO TABS
10.0000 mg | ORAL_TABLET | Freq: Once | ORAL | Status: AC
  Administered 2018-12-09: 13:00:00 5 mg via ORAL

## 2018-12-09 MED ORDER — IOPAMIDOL (ISOVUE-M 200) INJECTION 41%
15.0000 mL | Freq: Once | INTRAMUSCULAR | Status: AC
  Administered 2018-12-09: 15 mL via INTRATHECAL

## 2021-03-28 ENCOUNTER — Other Ambulatory Visit: Payer: Self-pay

## 2021-03-28 ENCOUNTER — Emergency Department (HOSPITAL_COMMUNITY)
Admission: EM | Admit: 2021-03-28 | Discharge: 2021-03-28 | Disposition: A | Discharge status: Home / Self Care | Payer: No Typology Code available for payment source | Attending: Emergency Medicine | Admitting: Emergency Medicine

## 2021-03-28 ENCOUNTER — Emergency Department (HOSPITAL_COMMUNITY): Payer: No Typology Code available for payment source

## 2021-03-28 ENCOUNTER — Encounter (HOSPITAL_COMMUNITY): Payer: Self-pay

## 2021-03-28 DIAGNOSIS — S300XXA Contusion of lower back and pelvis, initial encounter: Secondary | ICD-10-CM | POA: Insufficient documentation

## 2021-03-28 DIAGNOSIS — S1093XA Contusion of unspecified part of neck, initial encounter: Secondary | ICD-10-CM | POA: Diagnosis not present

## 2021-03-28 DIAGNOSIS — S8001XA Contusion of right knee, initial encounter: Secondary | ICD-10-CM | POA: Diagnosis not present

## 2021-03-28 DIAGNOSIS — Z7984 Long term (current) use of oral hypoglycemic drugs: Secondary | ICD-10-CM | POA: Insufficient documentation

## 2021-03-28 DIAGNOSIS — J45909 Unspecified asthma, uncomplicated: Secondary | ICD-10-CM | POA: Insufficient documentation

## 2021-03-28 DIAGNOSIS — I10 Essential (primary) hypertension: Secondary | ICD-10-CM | POA: Insufficient documentation

## 2021-03-28 DIAGNOSIS — S8991XA Unspecified injury of right lower leg, initial encounter: Secondary | ICD-10-CM | POA: Diagnosis present

## 2021-03-28 DIAGNOSIS — F17210 Nicotine dependence, cigarettes, uncomplicated: Secondary | ICD-10-CM | POA: Insufficient documentation

## 2021-03-28 DIAGNOSIS — Z79899 Other long term (current) drug therapy: Secondary | ICD-10-CM | POA: Diagnosis not present

## 2021-03-28 DIAGNOSIS — S0083XA Contusion of other part of head, initial encounter: Secondary | ICD-10-CM | POA: Diagnosis not present

## 2021-03-28 DIAGNOSIS — E119 Type 2 diabetes mellitus without complications: Secondary | ICD-10-CM | POA: Diagnosis not present

## 2021-03-28 DIAGNOSIS — Y92481 Parking lot as the place of occurrence of the external cause: Secondary | ICD-10-CM | POA: Diagnosis not present

## 2021-03-28 DIAGNOSIS — T07XXXA Unspecified multiple injuries, initial encounter: Secondary | ICD-10-CM

## 2021-03-28 MED ORDER — OXYCODONE-ACETAMINOPHEN 5-325 MG PO TABS
1.0000 | ORAL_TABLET | Freq: Once | ORAL | Status: AC
  Administered 2021-03-28: 1 via ORAL
  Filled 2021-03-28: qty 1
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
## 2023-07-12 NOTE — Progress Notes (Deleted)
 Office Visit Note  Patient: Shawna Horton             Date of Birth: 04-07-1961           MRN: [id number]             PCP[facility] Referring: Gillian Scarce, MD Visit Date: 07/26/2023 Occupation: @GUAROCC @  Subjective:  No chief complaint on file.   History of Present Illness: Shawna Horton is a 63 y.o. female ***   MRI of right hand 01/29/23 No significant joint effusion however this could represent inflammatory or crystalline arthropathy.  Calcific periarthritis is another possibility.   Activities of Daily Living:  Patient reports morning stiffness for *** {minute/hour:19697}.   Patient {ACTIONS;DENIES/REPORTS:21021675::"Denies"} nocturnal pain.  Difficulty dressing/grooming: {ACTIONS;DENIES/REPORTS:21021675::"Denies"} Difficulty climbing stairs: {ACTIONS;DENIES/REPORTS:21021675::"Denies"} Difficulty getting out of chair: {ACTIONS;DENIES/REPORTS:21021675::"Denies"} Difficulty using hands for taps, buttons, cutlery, and/or writing: {ACTIONS;DENIES/REPORTS:21021675::"Denies"}  No Rheumatology ROS completed.   PMFS History:  Patient Active Problem List   Diagnosis Date Noted   S/P lumbar fusion 08/21/2017   Lumbar radiculopathy 08/16/2017   ONYCHOMYCOSIS, TOENAILS 08/15/2006   GERD 08/15/2006   OBESITY, MORBID 06/17/2006   ALCOHOL ABUSE 06/17/2006   HYPERLIPIDEMIA 04/19/2006   TOBACCO ABUSE 04/19/2006   RESTLESS LEG SYNDROME 04/19/2006   CARPAL TUNNEL SYNDROME, BILATERAL 04/19/2006   MENORRHAGIA 04/19/2006   DEGENERATIVE DISC DISEASE 04/19/2006   COUGH 04/19/2006   LIVER FUNCTION TESTS, ABNORMAL 04/19/2006   ELEVATED BLOOD PRESSURE WITHOUT DIAGNOSIS OF HYPERTENSION 04/19/2006    Past Medical History:  Diagnosis Date   Anxiety    Arthritis    Asthma    Back pain, chronic    Bursitis    Depression    Diabetes mellitus without complication (HCC)    GERD (gastroesophageal reflux disease)    Hyperlipemia    PTSD (post-traumatic stress disorder)      Family History  Problem Relation Age of Onset   Crohn's disease Mother    Hypertension Father    Past Surgical History:  Procedure Laterality Date   BILATERAL CARPAL TUNNEL RELEASE     mult  hand surgeries bilateral   FOOT SURGERY Left    SHOULDER ARTHROSCOPY     left   TRANSFORAMINAL LUMBAR INTERBODY FUSION (TLIF) WITH PEDICLE SCREW FIXATION 2 LEVEL N/A 08/21/2017   Procedure: TRANSFORAMINAL LUMBAR INTERBODY FUSION (TLIF)  L3-4, L4-5;  Surgeon: Venita Lick, MD;  Location: [facility] OR;  Service: Orthopedics;  Laterality: N/A;  5 hrs   Social History   Social History Narrative   Not on file    There is no immunization history on file for this patient.   Objective: Vital Signs: There were no vitals taken for this visit.   Physical Exam   Musculoskeletal Exam: ***  CDAI Exam: CDAI Score: -- Patient Global: --; Provider Global: -- Swollen: --; Tender: -- Joint Exam 07/26/2023   No joint exam has been documented for this visit   There is currently no information documented on the homunculus. Go to the Rheumatology activity and complete the homunculus joint exam.  Investigation: No additional findings.  Imaging: No results found.  Recent Labs: Lab Results  Component Value Date   WBC 5.2 08/12/2017   HGB 13.1 08/12/2017   PLT 290 08/12/2017   NA 140 08/12/2017   K 3.9 08/12/2017   CL 107 08/12/2017   CO2 21 (L) 08/12/2017   GLUCOSE 114 (H) 08/12/2017   BUN 10 08/12/2017   CREATININE 0.79 08/12/2017   BILITOT 0.9  08/30/2015   ALKPHOS 92 08/30/2015   AST 22 08/30/2015   ALT 34 08/30/2015   PROT 7.7 08/30/2015   ALBUMIN 4.0 08/30/2015   CALCIUM 9.0 08/12/2017   GFRAA >60 08/12/2017    Speciality Comments: No specialty comments available.  Procedures:  No procedures performed Allergies: Patient has no known allergies.   Assessment / Plan:     Visit Diagnoses: Pain in right hand - MRI nonspecific soft tissue thickening of 2nd MCP joint capsule/radial  collateral ligament with small cyst/erosions in subjacent head of second metacarpal.  Bilateral carpal tunnel syndrome  Lumbar radiculopathy  S/P lumbar fusion  History of gastroesophageal reflux (GERD)  History of hyperlipidemia  RLS (restless legs syndrome)  Essential hypertension  Other insomnia  History of anxiety  History of alcohol dependence (HCC)  Vitamin D deficiency  History of type 2 diabetes mellitus  Orders: No orders of the defined types were placed in this encounter.  No orders of the defined types were placed in this encounter.   Face-to-face time spent with patient was *** minutes. Greater than 50% of time was spent in counseling and coordination of care.  Follow-Up Instructions: No follow-ups on file.   Gearldine Bienenstock, PA-C  Note - This record has been created using Dragon software.  Chart creation errors have been sought, but may not always  have been located. Such creation errors do not reflect on  the standard of medical care.

## 2023-07-26 ENCOUNTER — Encounter: Payer: Non-veteran care | Admitting: Rheumatology

## 2023-07-26 DIAGNOSIS — Z8719 Personal history of other diseases of the digestive system: Secondary | ICD-10-CM

## 2023-07-26 DIAGNOSIS — E559 Vitamin D deficiency, unspecified: Secondary | ICD-10-CM

## 2023-07-26 DIAGNOSIS — Z8639 Personal history of other endocrine, nutritional and metabolic disease: Secondary | ICD-10-CM

## 2023-07-26 DIAGNOSIS — G2581 Restless legs syndrome: Secondary | ICD-10-CM

## 2023-07-26 DIAGNOSIS — F1021 Alcohol dependence, in remission: Secondary | ICD-10-CM

## 2023-07-26 DIAGNOSIS — M5416 Radiculopathy, lumbar region: Secondary | ICD-10-CM

## 2023-07-26 DIAGNOSIS — M79641 Pain in right hand: Secondary | ICD-10-CM

## 2023-07-26 DIAGNOSIS — Z981 Arthrodesis status: Secondary | ICD-10-CM

## 2023-07-26 DIAGNOSIS — G5603 Carpal tunnel syndrome, bilateral upper limbs: Secondary | ICD-10-CM

## 2023-07-26 DIAGNOSIS — G4709 Other insomnia: Secondary | ICD-10-CM

## 2023-07-26 DIAGNOSIS — I10 Essential (primary) hypertension: Secondary | ICD-10-CM

## 2023-07-26 DIAGNOSIS — Z8659 Personal history of other mental and behavioral disorders: Secondary | ICD-10-CM

## 2023-08-19 ENCOUNTER — Telehealth: Payer: Self-pay | Admitting: Rheumatology

## 2023-08-19 NOTE — Telephone Encounter (Signed)
 Pt called stating she did not mean to no show her new patient appt on 07/26/23. Pt states she was moving and did not know about the appointment. I let the pt know we do not reschedule after pt no shows their appt but pt wanted me to ask the DR anyway. I advised the pt that there was a reminder sent out on 07/19/23 and 07/25/23 also that she was active on mychart. Pt said she does not answer calls she when she does not know the number.

## 2023-08-19 NOTE — Telephone Encounter (Signed)
 I called patient, due to "no show" we will not reschedule appt.

## 2023-08-23 ENCOUNTER — Ambulatory Visit: Payer: Non-veteran care | Admitting: Rheumatology

## 2024-02-20 ENCOUNTER — Ambulatory Visit: Admitting: Podiatry
